# Patient Record
Sex: Male | Born: 1965 | Race: White | Hispanic: No | Marital: Married | Smoking: Current every day smoker
Health system: Southern US, Community
[De-identification: ages and names within clinical notes are randomized; demographics above are authoritative.]

## PROBLEM LIST (undated history)

## (undated) DIAGNOSIS — C4490 Unspecified malignant neoplasm of skin, unspecified: Secondary | ICD-10-CM

## (undated) DIAGNOSIS — E119 Type 2 diabetes mellitus without complications: Secondary | ICD-10-CM

## (undated) HISTORY — PX: BACK SURGERY: SHX140

---

## 2015-07-07 DIAGNOSIS — D126 Benign neoplasm of colon, unspecified: Secondary | ICD-10-CM | POA: Insufficient documentation

## 2017-04-09 HISTORY — PX: IRRIGATION AND DEBRIDEMENT ABSCESS: SHX5252

## 2017-04-12 HISTORY — PX: DENTAL SURGERY: SHX609

## 2017-08-06 HISTORY — PX: DENTAL SURGERY: SHX609

## 2021-11-24 ENCOUNTER — Encounter (HOSPITAL_COMMUNITY): Payer: Self-pay

## 2021-11-24 ENCOUNTER — Emergency Department (HOSPITAL_COMMUNITY)
Admission: EM | Admit: 2021-11-24 | Discharge: 2021-11-25 | Disposition: A | Discharge status: Home / Self Care | Payer: No Typology Code available for payment source | Attending: Emergency Medicine | Admitting: Emergency Medicine

## 2021-11-24 ENCOUNTER — Other Ambulatory Visit: Payer: Self-pay

## 2021-11-24 ENCOUNTER — Emergency Department (HOSPITAL_COMMUNITY): Payer: No Typology Code available for payment source

## 2021-11-24 DIAGNOSIS — R569 Unspecified convulsions: Secondary | ICD-10-CM | POA: Diagnosis not present

## 2021-11-24 DIAGNOSIS — S32021A Stable burst fracture of second lumbar vertebra, initial encounter for closed fracture: Secondary | ICD-10-CM | POA: Insufficient documentation

## 2021-11-24 DIAGNOSIS — E119 Type 2 diabetes mellitus without complications: Secondary | ICD-10-CM | POA: Diagnosis not present

## 2021-11-24 DIAGNOSIS — S34102A Unspecified injury to L2 level of lumbar spinal cord, initial encounter: Secondary | ICD-10-CM | POA: Diagnosis present

## 2021-11-24 DIAGNOSIS — X58XXXA Exposure to other specified factors, initial encounter: Secondary | ICD-10-CM | POA: Diagnosis not present

## 2021-11-24 HISTORY — DX: Type 2 diabetes mellitus without complications: E11.9

## 2021-11-24 HISTORY — DX: Unspecified malignant neoplasm of skin, unspecified: C44.90

## 2021-11-24 LAB — COMPREHENSIVE METABOLIC PANEL
ALT: 31 U/L (ref 0–44)
AST: 29 U/L (ref 15–41)
Albumin: 4 g/dL (ref 3.5–5.0)
Alkaline Phosphatase: 70 U/L (ref 38–126)
Anion gap: 9 (ref 5–15)
BUN: 18 mg/dL (ref 6–20)
CO2: 20 mmol/L — ABNORMAL LOW (ref 22–32)
Calcium: 8.7 mg/dL — ABNORMAL LOW (ref 8.9–10.3)
Chloride: 103 mmol/L (ref 98–111)
Creatinine, Ser: 1.14 mg/dL (ref 0.61–1.24)
GFR, Estimated: >60 mL/min (ref >60)
Glucose, Bld: 299 mg/dL — ABNORMAL HIGH (ref 70–99)
Potassium: 4.5 mmol/L (ref 3.5–5.1)
Sodium: 132 mmol/L — ABNORMAL LOW (ref 135–145)
Total Bilirubin: 0.6 mg/dL (ref 0.3–1.2)
Total Protein: 6.7 g/dL (ref 6.5–8.1)

## 2021-11-24 LAB — CBC WITH DIFFERENTIAL/PLATELET
Abs Immature Granulocytes: 0.35 10*3/uL — ABNORMAL HIGH (ref 0.00–0.07)
Basophils Absolute: 0.1 10*3/uL (ref 0.0–0.1)
Basophils Relative: 0 %
Eosinophils Absolute: 0 10*3/uL (ref 0.0–0.5)
Eosinophils Relative: 0 %
HCT: 44.2 % (ref 39.0–52.0)
Hemoglobin: 15.5 g/dL (ref 13.0–17.0)
Immature Granulocytes: 2 %
Lymphocytes Relative: 7 %
Lymphs Abs: 1.3 10*3/uL (ref 0.7–4.0)
MCH: 32.6 pg (ref 26.0–34.0)
MCHC: 35.1 g/dL (ref 30.0–36.0)
MCV: 92.9 fL (ref 80.0–100.0)
Monocytes Absolute: 1 10*3/uL (ref 0.1–1.0)
Monocytes Relative: 5 %
Neutro Abs: 16.7 10*3/uL — ABNORMAL HIGH (ref 1.7–7.7)
Neutrophils Relative %: 86 %
Platelets: 242 10*3/uL (ref 150–400)
RBC: 4.76 MIL/uL (ref 4.22–5.81)
RDW: 12 % (ref 11.5–15.5)
WBC: 19.4 10*3/uL — ABNORMAL HIGH (ref 4.0–10.5)
nRBC: 0 % (ref 0.0–0.2)

## 2021-11-24 LAB — ETHANOL: Alcohol, Ethyl (B): <10 mg/dL (ref <10)

## 2021-11-24 IMAGING — CR DG LUMBAR SPINE COMPLETE 4+V
5 series · 5 of 5 positions shown · non-contrast
Comparison: None Available.

CLINICAL DATA: Seizure, severe low back pain.

EXAM:
LUMBAR SPINE - COMPLETE 4+ VIEW

[l-spine ap]
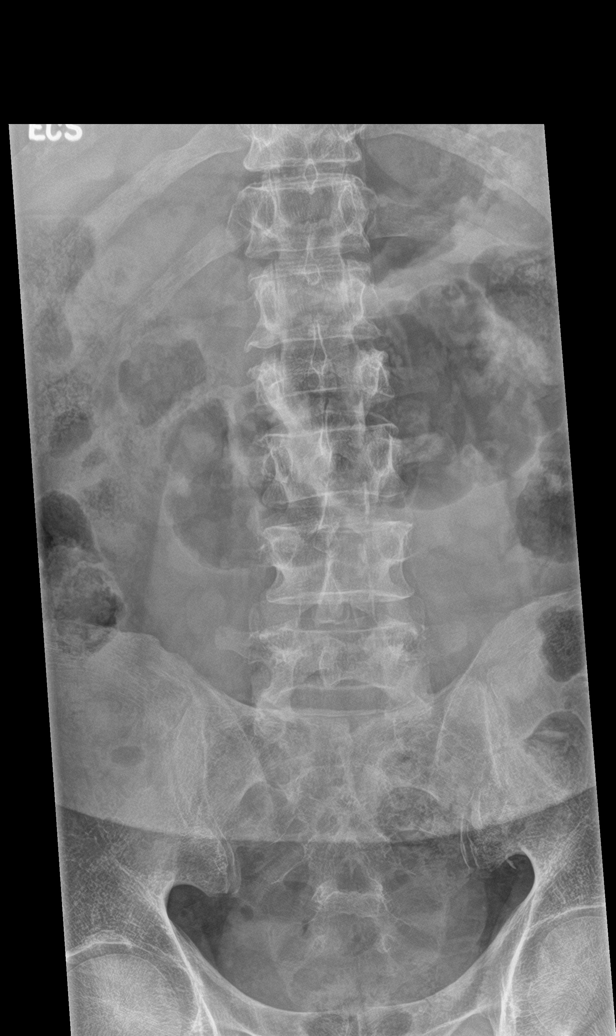

[l-spine obl (1 of 2)]
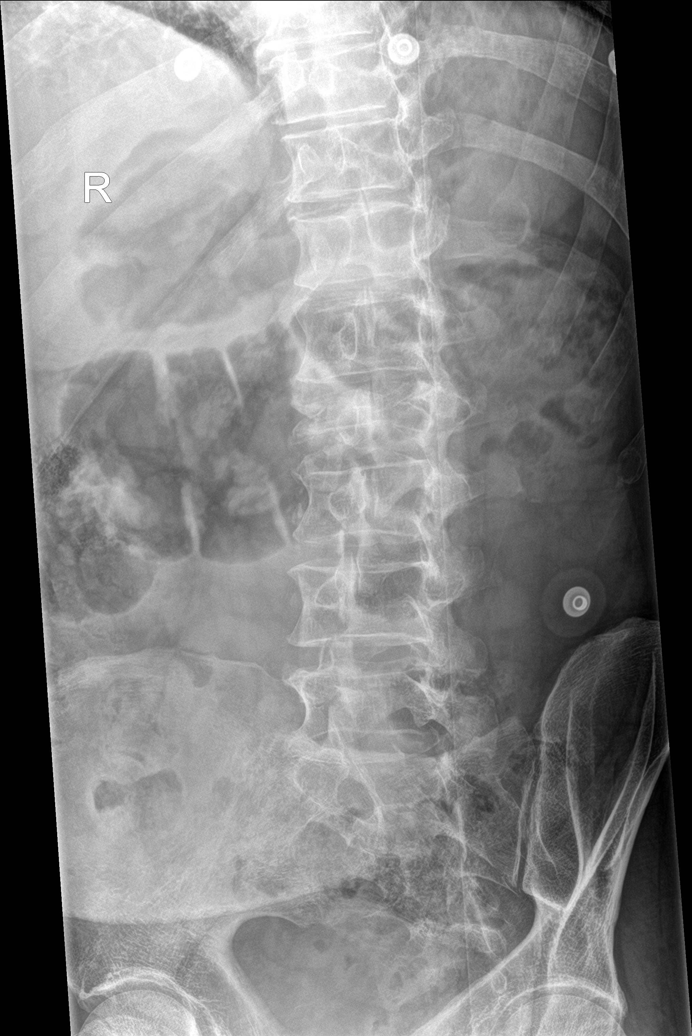

[l-spine obl (2 of 2)]
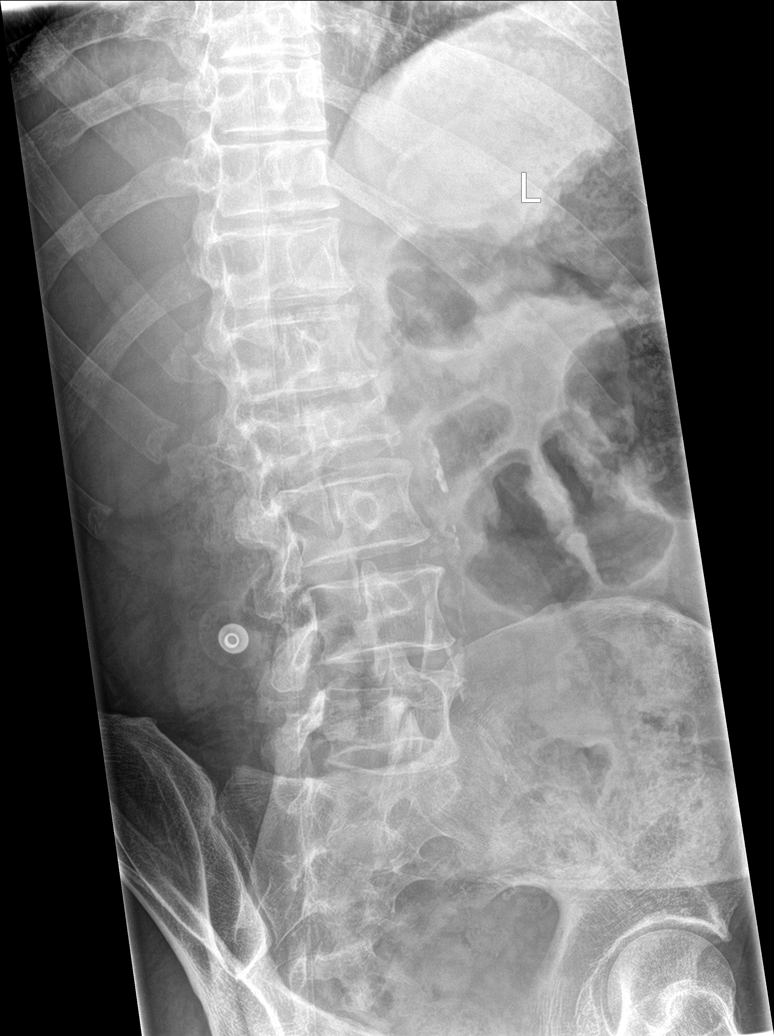

[l-spine lat]
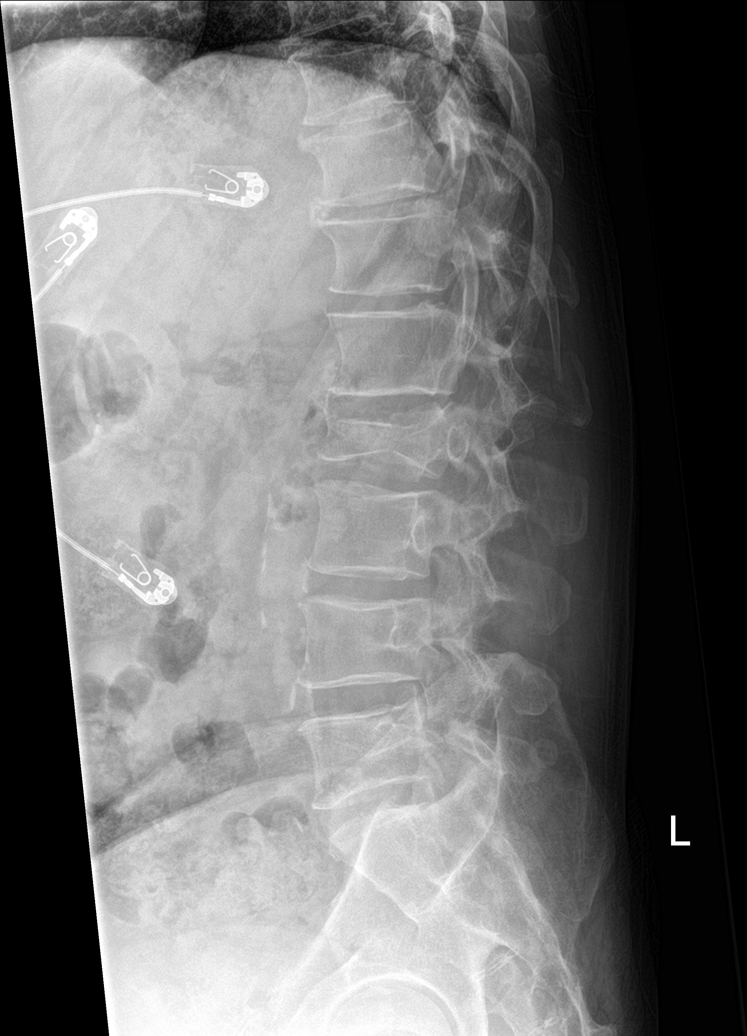

[l-spine spot]
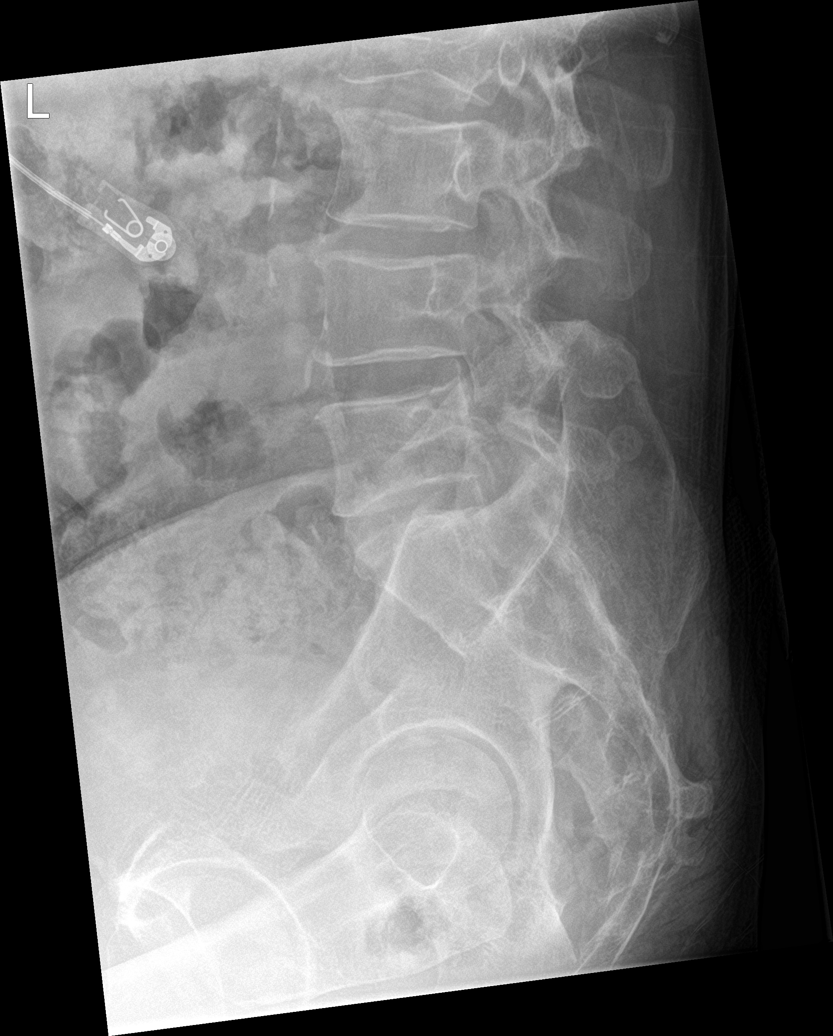

[5 of 5 positions shown; findings below may reference images not displayed]

FINDINGS: There is an acute compression deformity with a vertical elements at
L2 with loss of vertebral body height of approximately 60% a
slightly retropulsed element is noted. Mild retrolisthesis at L4-L5.
Intervertebral disc space is maintained in the lumbar spine. There
is mild intervertebral disc space narrowing with degenerative
endplate changes in the thoracic spine. Atherosclerotic
calcification of the aorta is noted.
IMPRESSION: Acute compression fracture at L2 with loss of vertebral body height
of approximately 60% and slightly retropulsed element.

## 2021-11-24 IMAGING — CT CT HEAD W/O CM
4 of 5 series · 15 of 47 positions shown, 17 images · non-contrast
Comparison: None Available.

CLINICAL DATA: New onset seizure



[Series 3: head without · axial · non-contrast · 0.46mm/px · z∈[-514,-394]mm · 5 of 38 slices shown, 7 images]
[im 7/38  brain]
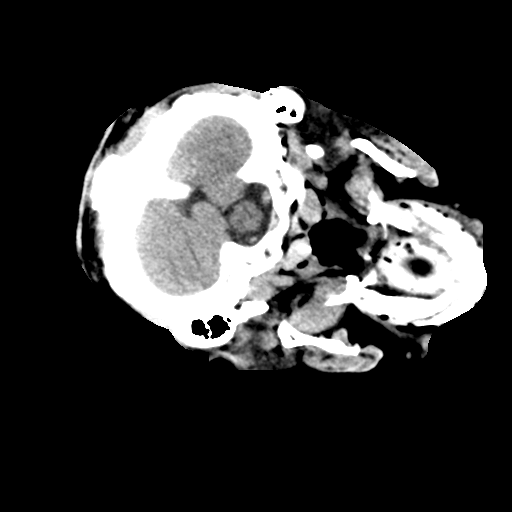
[im 7/38  bone]
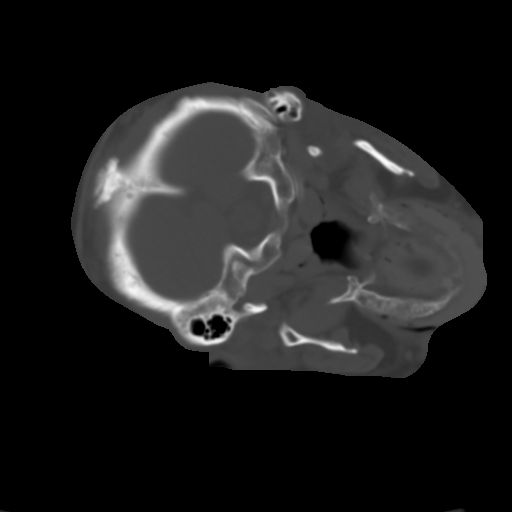
[im 13/38  brain]
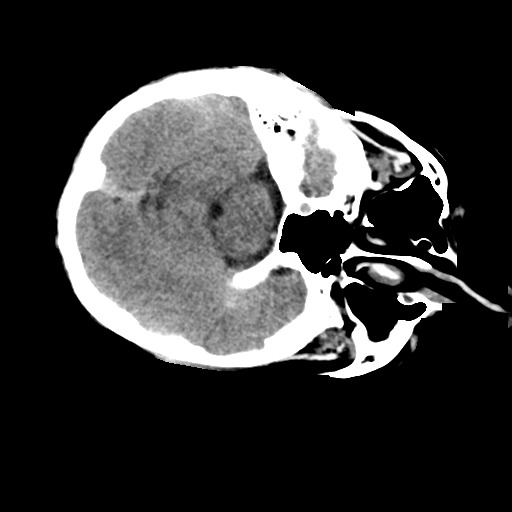
[im 19/38  brain]
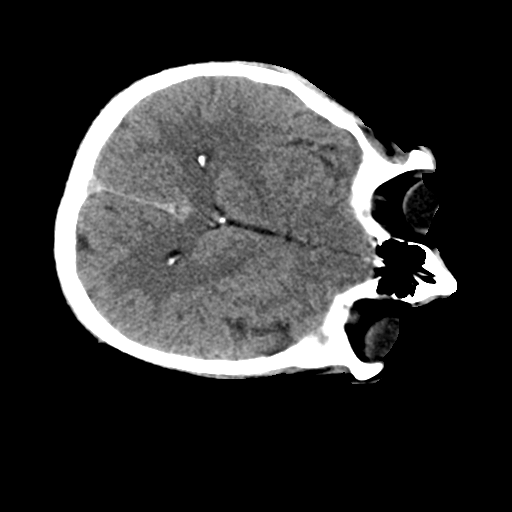
[im 25/38  brain]
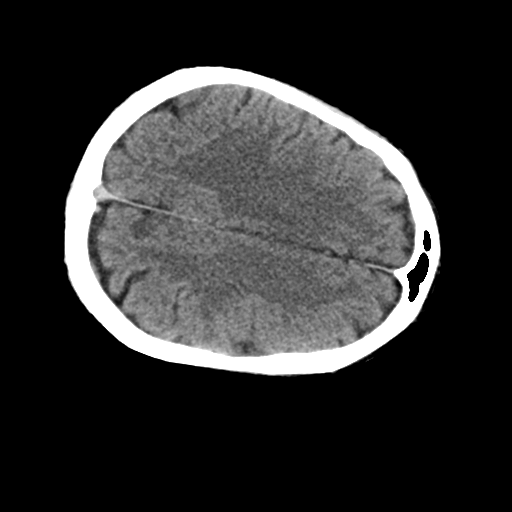
[im 31/38  brain]
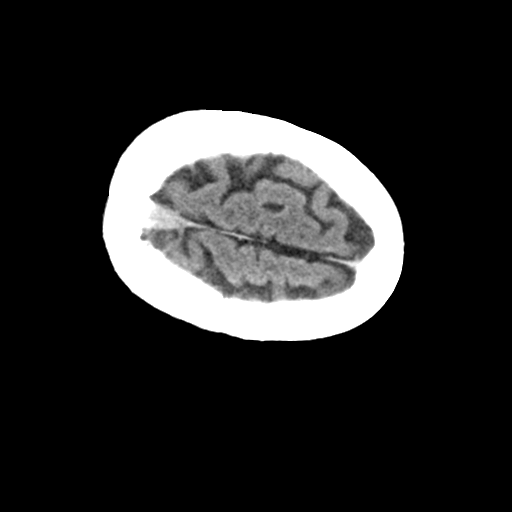
[im 31/38  bone]
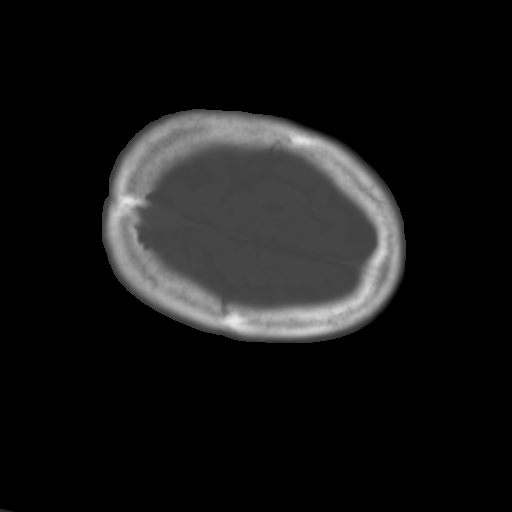

[Series 5: head without sag · coronal · non-contrast · 0.38mm/px · 3 of 71 slices shown]
[im 25/71  brain]
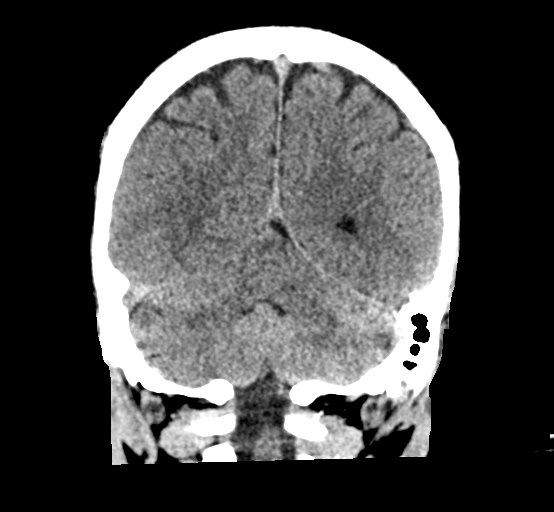
[im 32/71  brain]
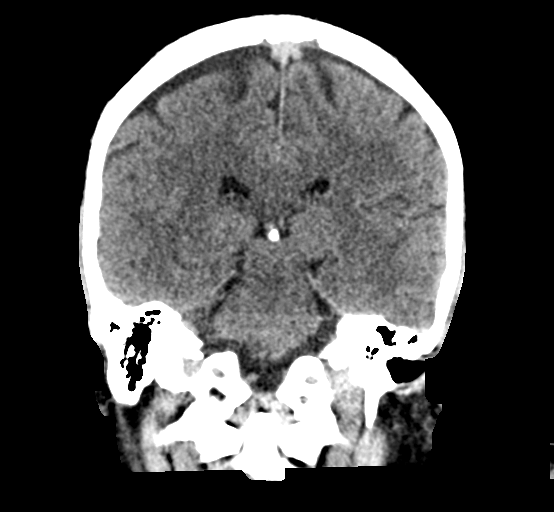
[im 39/71  brain]
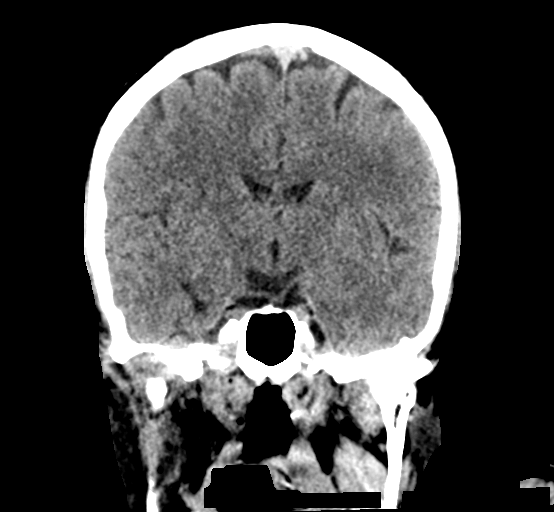

[Series 6: head without cor · sagittal · non-contrast · 0.38mm/px · 3 of 67 slices shown]
[im 23/67  brain]
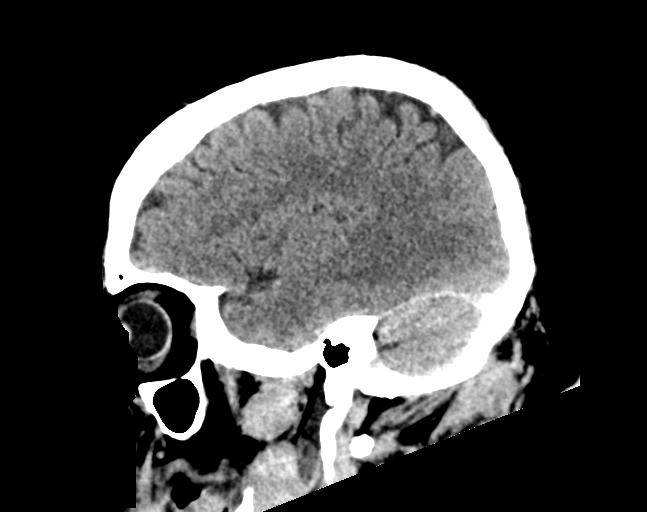
[im 34/67  brain]
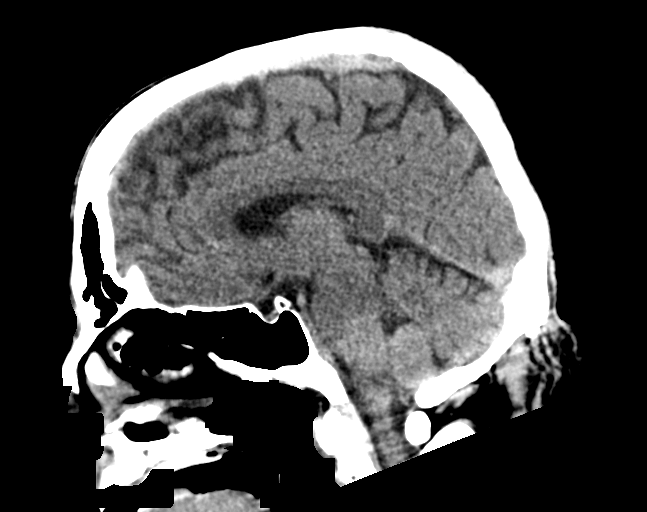
[im 45/67  brain]
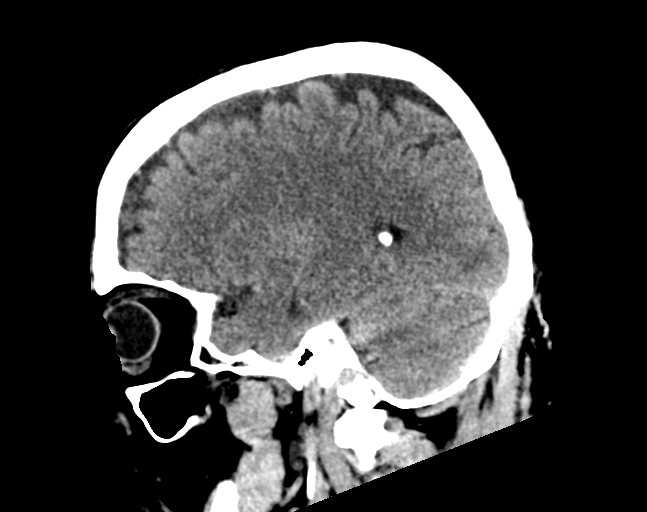

[Series 7: head without ax · axial · non-contrast · 0.36mm/px · z∈[-468,-381]mm · 4 of 39 slices shown]
[im 7/39  brain]
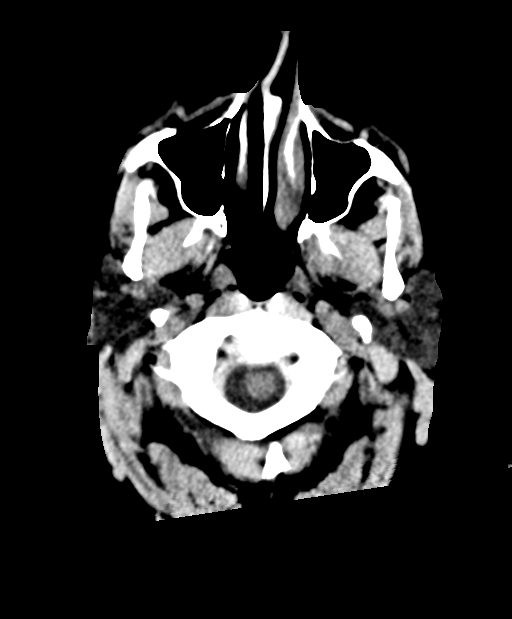
[im 13/39  brain]
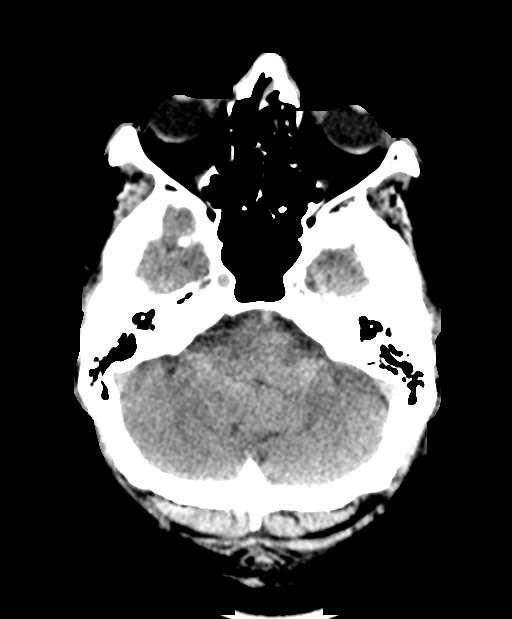
[im 20/39  brain]
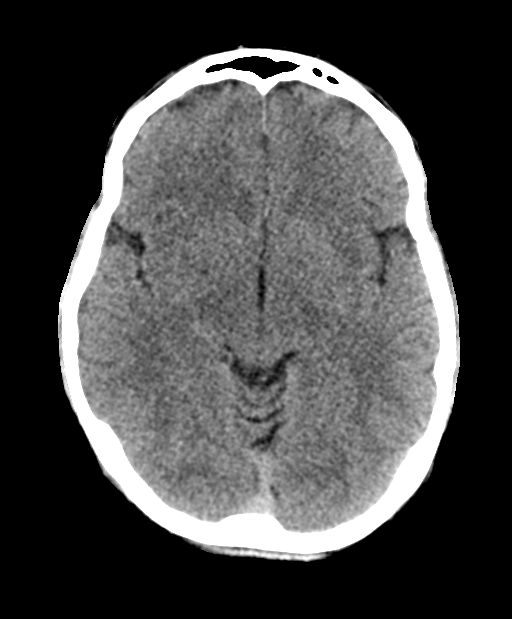
[im 26/39  brain]
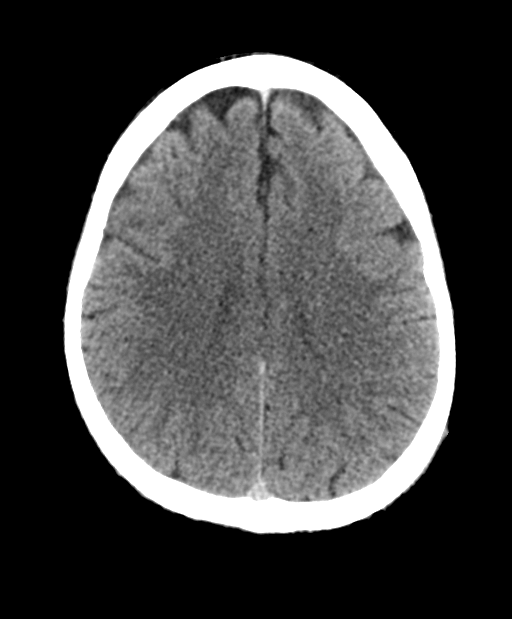

[15 of 47 positions shown; findings below may reference images not displayed]

FINDINGS: Brain: No acute infarct or hemorrhage. Lateral ventricles and
midline structures are unremarkable. No acute extra-axial fluid
collections. No mass effect.

Vascular: No hyperdense vessel or unexpected calcification.

Skull: Normal. Negative for fracture or focal lesion.

Sinuses/Orbits: No acute finding.

Other: None.
IMPRESSION: 1. No acute intracranial process.

## 2021-11-24 IMAGING — CT CT L SPINE W/O CM
4 of 6 series · 13 of 33 positions shown, 15 images · non-contrast
Comparison: No prior CT, correlation is made with lumbar spine
radiographs [DATE]

CLINICAL DATA: Seizure, severe low back pain, L2 compression
fracture on x-ray.



[Series 4: orthogonal · axial · 0.21mm/px · z∈[-614,-452]mm · 5 of 114 slices shown, 7 images]
[im 19/114  soft-tissue]
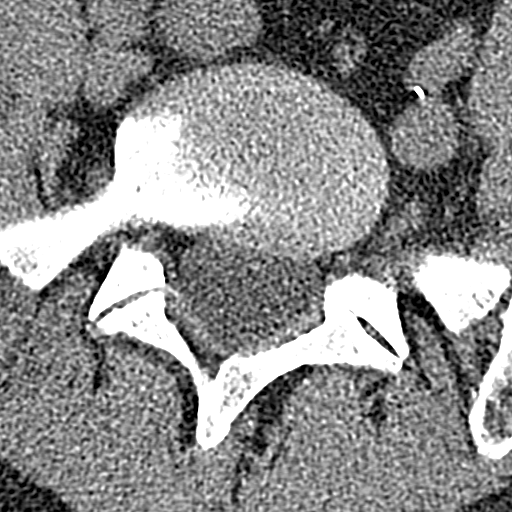
[im 19/114  bone]
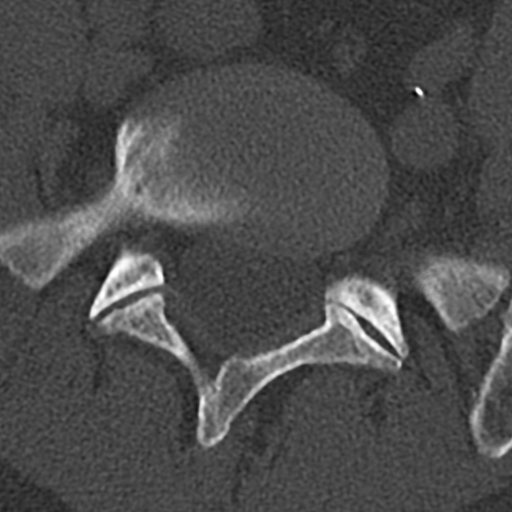
[im 38/114  bone]
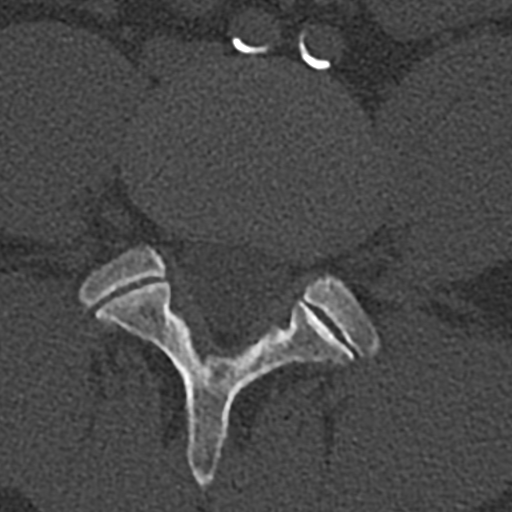
[im 57/114  bone]
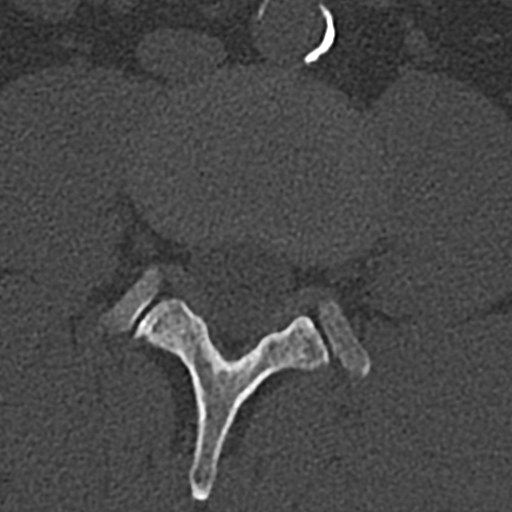
[im 76/114  bone]
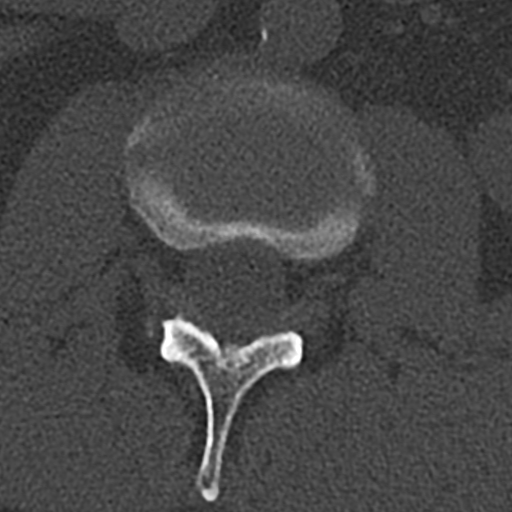
[im 95/114  soft-tissue]
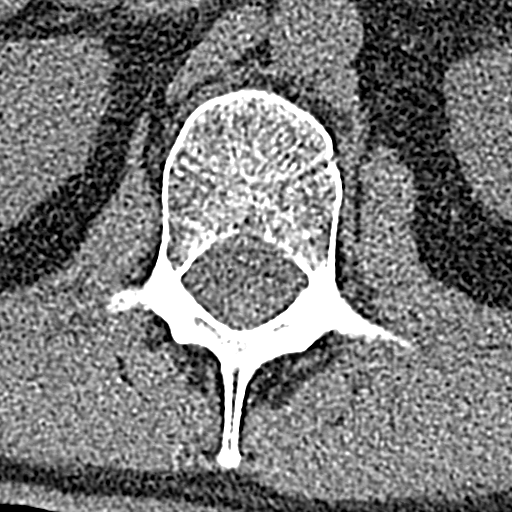
[im 95/114  bone]
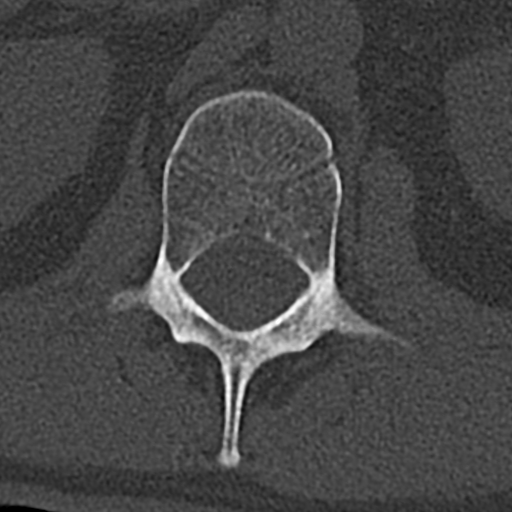

[Series 6: l spine soft · axial · 0.28mm/px · z∈[-598,-562]mm · 2 of 110 slices shown]
[im 19/110  soft-tissue]
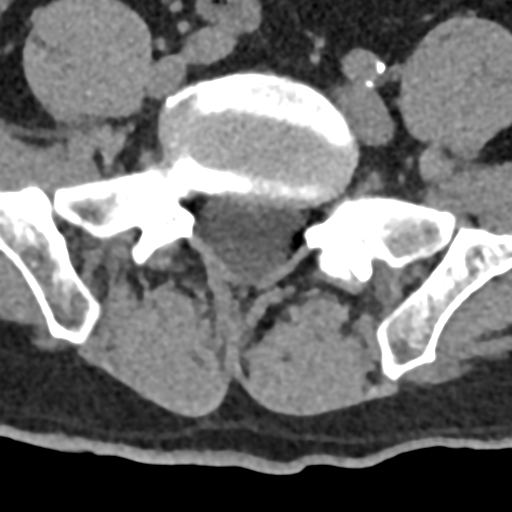
[im 37/110  soft-tissue]
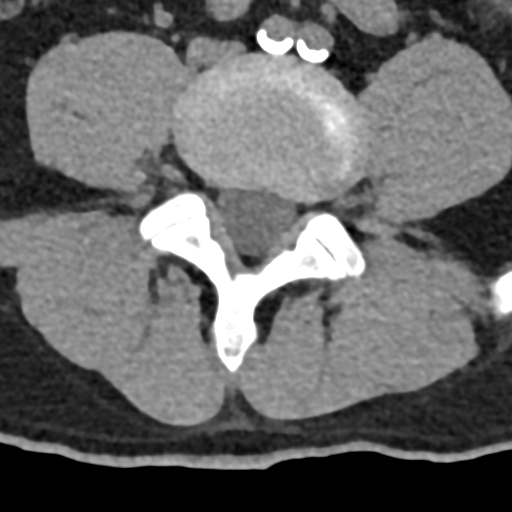

[Series 8: sag bone · sagittal · 0.29mm/px · 5 of 74 slices shown]
[im 13/74  bone]
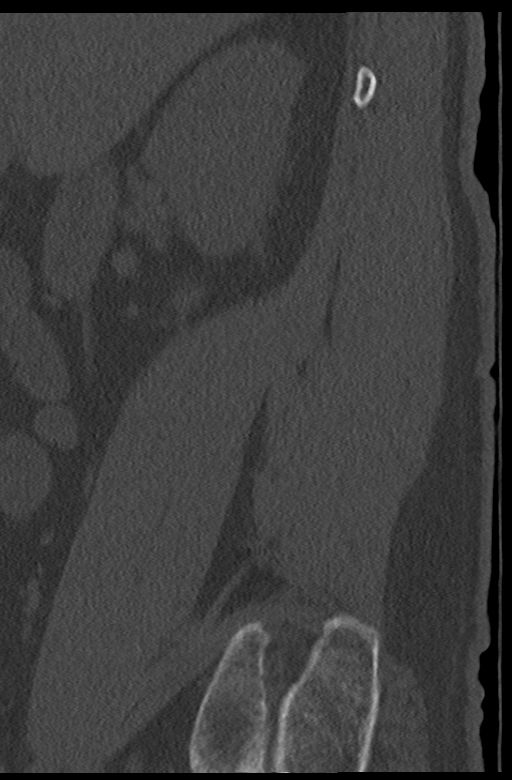
[im 25/74  bone]
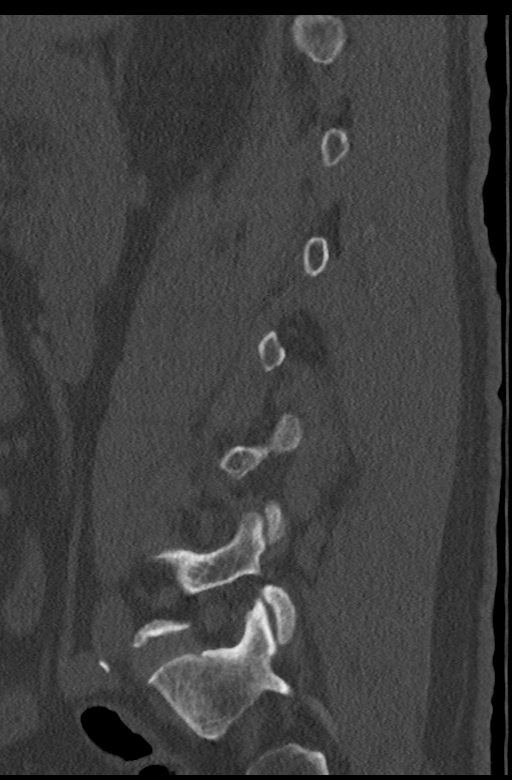
[im 37/74  bone]
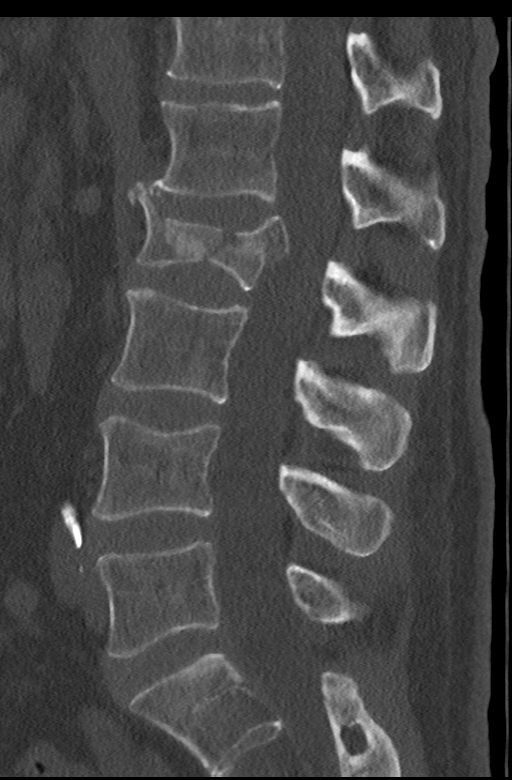
[im 49/74  bone]
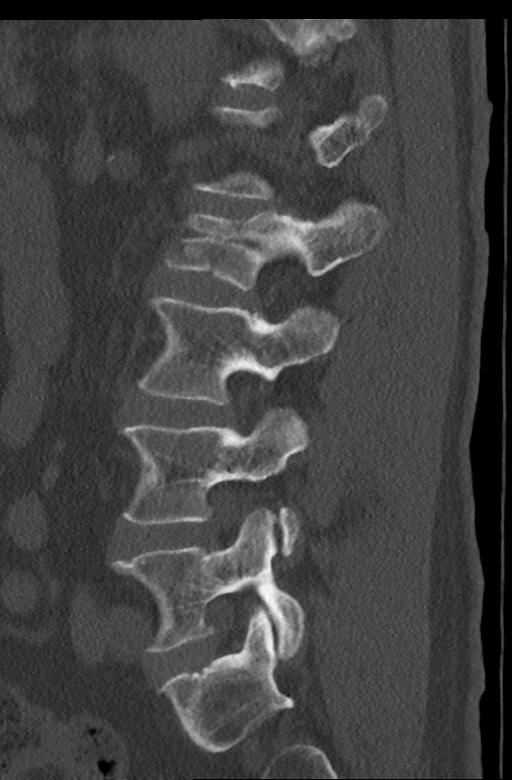
[im 61/74  bone]
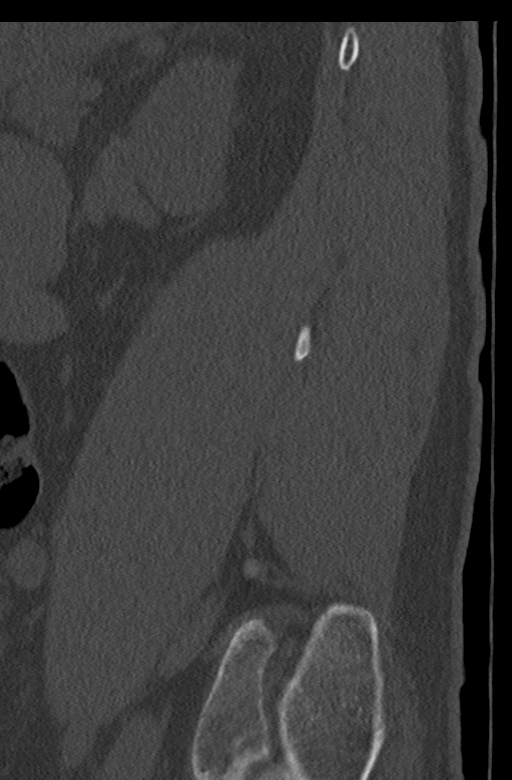

[Series 10: cor bone · coronal · 0.29mm/px · 1 of 74 slices shown]
[im 37/74  bone]
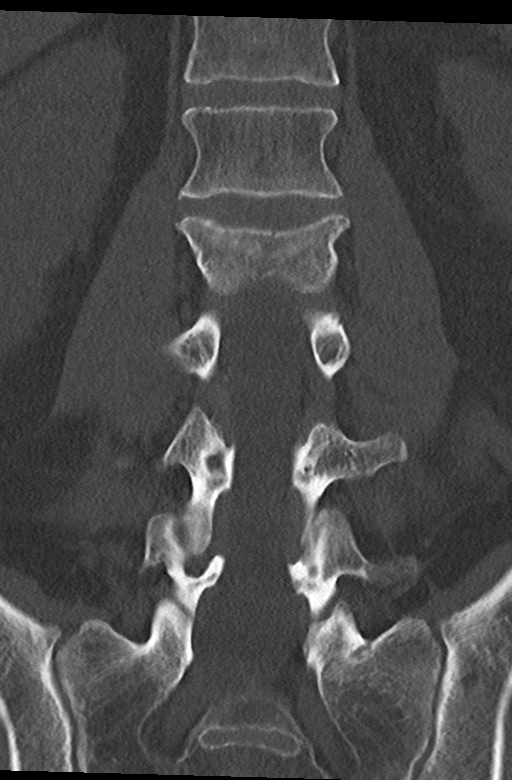

[13 of 33 positions shown; findings below may reference images not displayed]

FINDINGS: Segmentation: 5 lumbar type vertebrae.

Alignment: No listhesis.  Mild levocurvature.

Vertebrae: Acute burst fracture of the L2 vertebral body, which
extends through the anterior, posterior, superior, and inferior
cortex but does not appear to involve the posterior elements. There
is approximately 65% vertebral body height loss, some of which may
have been present prior to this acute fracture. 8 mm retropulsion of
the posterosuperior cortex, which causes approximately 40% canal
narrowing. No other acute fracture or suspicious osseous lesion.

Paraspinal and other soft tissues: Knee aortic atherosclerosis. No
acute finding.

Disc levels:

T12-L1: No significant disc bulge. No spinal canal stenosis or
neural foraminal narrowing.

L1-L2: Minimal disc bulge. No spinal canal stenosis at the level of
the disc space. 8 mm retropulsion of the posterosuperior cortex of
L2, which causes at least mild spinal canal stenosis and narrows the
lateral recesses. No neural foraminal narrowing.

L2-L3: Minimal disc bulge. No spinal canal stenosis or neural
foraminal narrowing.

L3-L4: No significant disc bulge. No spinal canal stenosis or neural
foraminal narrowing.

L4-L5: Minimal disc bulge. No spinal canal stenosis or neural
foraminal narrowing.

L5-S1: No significant disc bulge. No spinal canal stenosis or neural
foraminal narrowing.
IMPRESSION: 1. Acute burst fracture of L2, which does not appear to involve the
posterior elements, with 8 mm retropulsion of the posterosuperior
cortex, resulting in 40% spinal canal narrowing just below the L1-L2
level. This may have been superimposed on a more chronic compression
deformity.
2. No other spinal canal stenosis or neural foraminal narrowing.

## 2021-11-24 MED ORDER — DIAZEPAM 5 MG/ML IJ SOLN
2.5000 mg | Freq: Once | INTRAMUSCULAR | Status: AC
  Administered 2021-11-24: 2.5 mg via INTRAVENOUS
  Filled 2021-11-24: qty 2

## 2021-11-24 MED ORDER — OXYCODONE-ACETAMINOPHEN 5-325 MG PO TABS
1.0000 | ORAL_TABLET | Freq: Once | ORAL | Status: AC
  Administered 2021-11-24: 1 via ORAL
  Filled 2021-11-24: qty 1

## 2021-11-24 MED ORDER — HYDROMORPHONE HCL 1 MG/ML IJ SOLN
1.0000 mg | Freq: Once | INTRAMUSCULAR | Status: AC
  Administered 2021-11-24: 1 mg via INTRAVENOUS
  Filled 2021-11-24: qty 1

## 2021-11-24 NOTE — ED Provider Notes (Signed)
[facility] EMERGENCY DEPARTMENT Provider Note   CSN: [id number] Arrival date & time: 11/24/21  2129     History {Add pertinent medical, surgical, social history, OB history to HPI:1} Chief Complaint  Patient presents with   Back Pain    Lenward Able is a 56 y.o. male.  The history is provided by the patient and medical records.  Back Pain  55 y.o. M with hx of DM presenting to the ED for possible seizure.  Patient states the last thing he remembers was that he was laying down on the couch and when he came to he was in the floor with EMS around him.  Wife thinks he had a seizure per EMS.  He has no hx of seizures or other neurologic issues in the past.  He denies any headache or confusion but does have severe lower back pain.  States it feels like muscle spasms.  He denies numbness/weakness of the legs.  No bowel or bladder incontinence.  Home Medications Prior to Admission medications   Not on File      Allergies    Patient has no known allergies.    Review of Systems   Review of Systems  Musculoskeletal:  Positive for back pain.  All other systems reviewed and are negative.  Physical Exam Updated Vital Signs BP 106/78   Pulse 75   Temp 98 F (36.7 C) (Oral)   Resp (!) 21   Ht '5\' 8"'$  (1.727 m)   Wt 72.6 kg   SpO2 94%   BMI 24.33 kg/m   Physical Exam Vitals and nursing note reviewed.  Constitutional:      Appearance: He is well-developed.     Comments: Intermittently yelling and screaming during exam  HENT:     Head: Normocephalic and atraumatic.     Comments: atraumatic    Mouth/Throat:     Comments: No tongue laceration or dental injury noted Eyes:     Conjunctiva/sclera: Conjunctivae normal.     Pupils: Pupils are equal, round, and reactive to light.     Comments: PERRL  Cardiovascular:     Rate and Rhythm: Normal rate and regular rhythm.     Heart sounds: Normal heart sounds.  Pulmonary:     Effort: Pulmonary effort is normal.  No respiratory distress.     Breath sounds: Normal breath sounds. No rhonchi.  Abdominal:     General: Bowel sounds are normal.     Palpations: Abdomen is soft.  Musculoskeletal:        General: Normal range of motion.     Cervical back: Normal range of motion.  Skin:    General: Skin is warm and dry.  Neurological:     Mental Status: He is alert and oriented to person, place, and time.     Comments: AAOx3, screaming and yelling during exam but able to answer questions and follow commands, extremities NVI x4    ED Results / Procedures / Treatments   Labs (all labs ordered are listed, but only abnormal results are displayed) Labs Reviewed  CBC WITH DIFFERENTIAL/PLATELET - Abnormal; Notable for the following components:      Result Value   WBC 19.4 (*)    Neutro Abs 16.7 (*)    Abs Immature Granulocytes 0.35 (*)    All other components within normal limits  COMPREHENSIVE METABOLIC PANEL  ETHANOL  RAPID URINE DRUG SCREEN, HOSP PERFORMED    EKG None  Radiology DG Lumbar Spine Complete  Result  Date: 11/24/2021 CLINICAL DATA:  Seizure, severe low back pain. EXAM: LUMBAR SPINE - COMPLETE 4+ VIEW COMPARISON:  None Available. FINDINGS: There is an acute compression deformity with a vertical elements at L2 with loss of vertebral body height of approximately 60% a slightly retropulsed element is noted. Mild retrolisthesis at L4-L5. Intervertebral disc space is maintained in the lumbar spine. There is mild intervertebral disc space narrowing with degenerative endplate changes in the thoracic spine. Atherosclerotic calcification of the aorta is noted. IMPRESSION: Acute compression fracture at L2 with loss of vertebral body height of approximately 60% and slightly retropulsed element. Electronically Signed   By: Brett Fairy M.D.   On: 11/24/2021 22:56   CT HEAD WO CONTRAST (5MM)  Result Date: 11/24/2021 CLINICAL DATA:  New onset seizure EXAM: CT HEAD WITHOUT CONTRAST TECHNIQUE: Contiguous  axial images were obtained from the base of the skull through the vertex without intravenous contrast. RADIATION DOSE REDUCTION: This exam was performed according to the departmental dose-optimization program which includes automated exposure control, adjustment of the mA and/or kV according to patient size and/or use of iterative reconstruction technique. COMPARISON:  None Available. FINDINGS: Brain: No acute infarct or hemorrhage. Lateral ventricles and midline structures are unremarkable. No acute extra-axial fluid collections. No mass effect. Vascular: No hyperdense vessel or unexpected calcification. Skull: Normal. Negative for fracture or focal lesion. Sinuses/Orbits: No acute finding. Other: None. IMPRESSION: 1. No acute intracranial process. Electronically Signed   By: Randa Ngo M.D.   On: 11/24/2021 23:04    Procedures Procedures  {Document cardiac monitor, telemetry assessment procedure when appropriate:1}  Medications Ordered in ED Medications  HYDROmorphone (DILAUDID) injection 1 mg (has no administration in time range)  diazepam (VALIUM) injection 2.5 mg (has no administration in time range)  oxyCODONE-acetaminophen (PERCOCET/ROXICET) 5-325 MG per tablet 1 tablet (1 tablet Oral Given 11/24/21 2256)    ED Course/ Medical Decision Making/ A&P                           Medical Decision Making Amount and/or Complexity of Data Reviewed Labs: ordered. Radiology: ordered.  Risk Prescription drug management.   56 year old M presenting to the ED after questionable seizure.  Last thing he remembers was he was lying down on the couch to take a nap, woke up in the floor with paramedics around him.  He has no history of seizures in the past.  He denies any headache or confusion but reports severe low back pain.  He denies any numbness or weakness of the legs.  No bowel or bladder incontinence.  We will check screening labs, CT head given questionable new onset seizure, lumbar films.   Percocet given for pain.  X-rays with L2 compression fracture, approximately 60% height loss and some mild retropulsion.  CT head is negative.  Remains neurologically intact.  Discussed with neurosurgery, PA Kim-- recommends CT lumbar spine, as he is diabetic we will avoid steroids, pain meds, muscle relaxers, TLSO brace and office follow-up in 1 week.  Final Clinical Impression(s) / ED Diagnoses Final diagnoses:  None    Rx / DC Orders ED Discharge Orders     None

## 2021-11-24 NOTE — ED Triage Notes (Signed)
Arrived via EMS due to apparent seizure.  Wife heard patient yell out, she then found him having what was an apparent seizure with foaming at the mouth.  Patient also complains with severe lower back pain.

## 2021-11-25 ENCOUNTER — Emergency Department (HOSPITAL_COMMUNITY)
Admission: EM | Admit: 2021-11-25 | Discharge: 2021-11-26 | Disposition: A | Discharge status: Home / Self Care | Payer: No Typology Code available for payment source | Attending: Emergency Medicine | Admitting: Emergency Medicine

## 2021-11-25 DIAGNOSIS — W08XXXA Fall from other furniture, initial encounter: Secondary | ICD-10-CM | POA: Insufficient documentation

## 2021-11-25 DIAGNOSIS — S32021A Stable burst fracture of second lumbar vertebra, initial encounter for closed fracture: Secondary | ICD-10-CM | POA: Diagnosis not present

## 2021-11-25 DIAGNOSIS — E1165 Type 2 diabetes mellitus with hyperglycemia: Secondary | ICD-10-CM | POA: Insufficient documentation

## 2021-11-25 DIAGNOSIS — J45909 Unspecified asthma, uncomplicated: Secondary | ICD-10-CM | POA: Diagnosis not present

## 2021-11-25 DIAGNOSIS — S3992XA Unspecified injury of lower back, initial encounter: Secondary | ICD-10-CM | POA: Diagnosis present

## 2021-11-25 DIAGNOSIS — D72829 Elevated white blood cell count, unspecified: Secondary | ICD-10-CM | POA: Insufficient documentation

## 2021-11-25 MED ORDER — SODIUM CHLORIDE 0.9 % IV BOLUS
1000.0000 mL | Freq: Once | INTRAVENOUS | Status: AC
  Administered 2021-11-26: 1000 mL via INTRAVENOUS

## 2021-11-25 MED ORDER — OXYCODONE-ACETAMINOPHEN 5-325 MG PO TABS: 1.0000 | ORAL_TABLET | ORAL | 0 refills | Status: DC | PRN

## 2021-11-25 MED ORDER — METHOCARBAMOL 500 MG PO TABS: 500.0000 mg | ORAL_TABLET | Freq: Two times a day (BID) | ORAL | 0 refills | Status: DC

## 2021-11-25 MED ORDER — HYDROMORPHONE HCL 1 MG/ML IJ SOLN
1.0000 mg | INTRAMUSCULAR | Status: AC
  Administered 2021-11-26 (×2): 1 mg via INTRAVENOUS
  Filled 2021-11-25 (×2): qty 1

## 2021-11-25 NOTE — ED Provider Notes (Incomplete)
[facility] EMERGENCY DEPARTMENT Provider Note   CSN: [id number] Arrival date & time: 11/25/21  1834     History {Add pertinent medical, surgical, social history, OB history to HPI:1} Chief Complaint  Patient presents with  . Back Pain    Cody Berger is a 56 y.o. male.  HPI  Medical history including diabetes, asthma, chronic migraines presents  with complaints of worsening lower back pain.  Patient states that he was seen here yesterday was diagnosed with a fractured lumbar spine.  He states yesterday he fell asleep on his couch and then woke up with paramedics all around him.  He states he does not know what happened.  He states that he was in significant amount of pain, he denies history of seizure disorders, denies any recent head trauma, he is not on anticoag, he denies any illicit drug use, he denies alcohol dependency or alcoholic seizures, he denies any trauma to his back that he is aware of.  He states that since being discharged from the hospital he is having worsening pain in his back, pain remains right in his lower lumbar region does not radiate, has a slight sensation of feeling slightly more cold on the left leg, but denies bowel or urinary incontinency, no saddle paresthesias.  He denies any headache change in vision paresthesias or weakness of her lower extremities.  He has no other complaints.  Patient's wife is at bedside able to validate the story, she states that yesterday she heard her husband screaming in pain she found him on the couch shaking and was not coherent, she states he had fully body shaking for about 30 secound to a minute and never truly regained consciousness.  She states is never happened in the past.  Lab review patient's chart was seen yesterday has acute burst fracture of L2 with 8 mm repolarization 40% spinal canal narrowing below the L1-L2 level.  He was placed in a TL SLO brace and was recommended follow-up in 1 week outpatient  neurosurgery.  Home Medications Prior to Admission medications   Medication Sig Start Date End Date Taking? Authorizing Provider  methocarbamol (ROBAXIN) 500 MG tablet Take 1 tablet (500 mg total) by mouth 2 (two) times daily. 11/25/21   [name], PA-C  oxyCODONE-acetaminophen (PERCOCET) 5-325 MG tablet Take 1 tablet by mouth every 4 (four) hours as needed. 11/25/21   [name], PA-C      Allergies    Patient has no known allergies.    Review of Systems   Review of Systems  Constitutional:  Negative for chills and fever.  Respiratory:  Negative for shortness of breath.   Cardiovascular:  Negative for chest pain.  Gastrointestinal:  Negative for abdominal pain.  Musculoskeletal:  Positive for back pain.  Neurological:  Negative for headaches.   Physical Exam Updated Vital Signs BP (!) 130/96 (BP Location: Left Arm)   Pulse (!) 101   Temp 99 F (37.2 C) (Oral)   Resp 17   SpO2 96%  Physical Exam Vitals and nursing note reviewed.  Constitutional:      General: He is not in acute distress.    Appearance: He is not ill-appearing.  HENT:     Head: Normocephalic and atraumatic.     Nose: No congestion.  Eyes:     Conjunctiva/sclera: Conjunctivae normal.  Cardiovascular:     Rate and Rhythm: Normal rate and regular rhythm.     Pulses: Normal pulses.     Heart  sounds: No murmur heard.   No friction rub. No gallop.  Pulmonary:     Effort: No respiratory distress.     Breath sounds: No wheezing, rhonchi or rales.  Skin:    General: Skin is warm and dry.  Neurological:     Mental Status: He is alert.  Psychiatric:        Mood and Affect: Mood normal.    ED Results / Procedures / Treatments   Labs (all labs ordered are listed, but only abnormal results are displayed) Labs Reviewed - No data to display  EKG None  Radiology DG Lumbar Spine Complete  Result Date: 11/24/2021 CLINICAL DATA:  Seizure, severe low back pain. EXAM: LUMBAR SPINE - COMPLETE 4+  VIEW COMPARISON:  None Available. FINDINGS: There is an acute compression deformity with a vertical elements at L2 with loss of vertebral body height of approximately 60% a slightly retropulsed element is noted. Mild retrolisthesis at L4-L5. Intervertebral disc space is maintained in the lumbar spine. There is mild intervertebral disc space narrowing with degenerative endplate changes in the thoracic spine. Atherosclerotic calcification of the aorta is noted. IMPRESSION: Acute compression fracture at L2 with loss of vertebral body height of approximately 60% and slightly retropulsed element. Electronically Signed   By: Brett Fairy M.D.   On: 11/24/2021 22:56   CT HEAD WO CONTRAST (5MM)  Result Date: 11/24/2021 CLINICAL DATA:  New onset seizure EXAM: CT HEAD WITHOUT CONTRAST TECHNIQUE: Contiguous axial images were obtained from the base of the skull through the vertex without intravenous contrast. RADIATION DOSE REDUCTION: This exam was performed according to the departmental dose-optimization program which includes automated exposure control, adjustment of the mA and/or kV according to patient size and/or use of iterative reconstruction technique. COMPARISON:  None Available. FINDINGS: Brain: No acute infarct or hemorrhage. Lateral ventricles and midline structures are unremarkable. No acute extra-axial fluid collections. No mass effect. Vascular: No hyperdense vessel or unexpected calcification. Skull: Normal. Negative for fracture or focal lesion. Sinuses/Orbits: No acute finding. Other: None. IMPRESSION: 1. No acute intracranial process. Electronically Signed   By: Randa Ngo M.D.   On: 11/24/2021 23:04   CT Lumbar Spine Wo Contrast  Result Date: 11/25/2021 CLINICAL DATA:  Seizure, severe low back pain, L2 compression fracture on x-ray. EXAM: CT LUMBAR SPINE WITHOUT CONTRAST TECHNIQUE: Multidetector CT imaging of the lumbar spine was performed without intravenous contrast administration. Multiplanar  CT image reconstructions were also generated. RADIATION DOSE REDUCTION: This exam was performed according to the departmental dose-optimization program which includes automated exposure control, adjustment of the mA and/or kV according to patient size and/or use of iterative reconstruction technique. COMPARISON:  No prior CT, correlation is made with lumbar spine radiographs 11/24/2021 FINDINGS: Segmentation: 5 lumbar type vertebrae. Alignment: No listhesis.  Mild levocurvature. Vertebrae: Acute burst fracture of the L2 vertebral body, which extends through the anterior, posterior, superior, and inferior cortex but does not appear to involve the posterior elements. There is approximately 65% vertebral body height loss, some of which may have been present prior to this acute fracture. 8 mm retropulsion of the posterosuperior cortex, which causes approximately 40% canal narrowing. No other acute fracture or suspicious osseous lesion. Paraspinal and other soft tissues: Knee aortic atherosclerosis. No acute finding. Disc levels: T12-L1: No significant disc bulge. No spinal canal stenosis or neural foraminal narrowing. L1-L2: Minimal disc bulge. No spinal canal stenosis at the level of the disc space. 8 mm retropulsion of the posterosuperior cortex of L2, which causes  at least mild spinal canal stenosis and narrows the lateral recesses. No neural foraminal narrowing. L2-L3: Minimal disc bulge. No spinal canal stenosis or neural foraminal narrowing. L3-L4: No significant disc bulge. No spinal canal stenosis or neural foraminal narrowing. L4-L5: Minimal disc bulge. No spinal canal stenosis or neural foraminal narrowing. L5-S1: No significant disc bulge. No spinal canal stenosis or neural foraminal narrowing. IMPRESSION: 1. Acute burst fracture of L2, which does not appear to involve the posterior elements, with 8 mm retropulsion of the posterosuperior cortex, resulting in 40% spinal canal narrowing just below the L1-L2  level. This may have been superimposed on a more chronic compression deformity. 2. No other spinal canal stenosis or neural foraminal narrowing. Electronically Signed   By: Merilyn Baba M.D.   On: 11/25/2021 00:19    Procedures Procedures  {Document cardiac monitor, telemetry assessment procedure when appropriate:1}  Medications Ordered in ED Medications  sodium chloride 0.9 % bolus 1,000 mL (has no administration in time range)  HYDROmorphone (DILAUDID) injection 1 mg (has no administration in time range)    ED Course/ Medical Decision Making/ A&P                           Medical Decision Making Risk Prescription drug management.   ***  {Document critical care time when appropriate:1} {Document review of labs and clinical decision tools ie heart score, Chads2Vasc2 etc:1}  {Document your independent review of radiology images, and any outside records:1} {Document your discussion with family members, caretakers, and with consultants:1} {Document social determinants of health affecting pt's care:1} {Document your decision making why or why not admission, treatments were needed:1} Final Clinical Impression(s) / ED Diagnoses Final diagnoses:  None    Rx / DC Orders ED Discharge Orders     None

## 2021-11-25 NOTE — Progress Notes (Signed)
Orthopedic Tech Progress Note Patient Details:  Cody Berger 1966/03/10 [id number]  Ortho Devices Type of Ortho Device: Thoracolumbar corset (TLSO) Ortho Device/Splint Interventions: Ordered, Application, Adjustment  I applied a TLSO to the patient with help from an ED EMT. The patient had a lot of pain before application and less afterward. Post Interventions Patient Tolerated: Well Instructions Provided: Care of device, Adjustment of device  Karolee Stamps 11/25/2021, 1:59 AM

## 2021-11-25 NOTE — ED Triage Notes (Signed)
Pt advises he was seen yesterday for seizure, advised he had closed, stable burst fracture of second lumbar vertebra. Was advised of need for admission but refused, [facility], advised to come back to ED & follow through w admission.

## 2021-11-25 NOTE — Discharge Instructions (Signed)
Please follow-up with neurosurgery.  I have attached information for local clinic.  If you wish to go through the [facility], I would call them in the morning to get this set up. Take the prescribed medication as directed.  Do not drive or make important decisions while taking this medication as it can make you drowsy/sleepy. Return to the ED for any new or acute changes--numbness or weakness of the legs, bowel or bladder incontinence, etc..

## 2021-11-25 NOTE — ED Provider Notes (Signed)
[facility] EMERGENCY DEPARTMENT Provider Note   CSN: [id number] Arrival date & time: 11/25/21  1834     History  Chief Complaint  Patient presents with   Back Pain    Cody Berger is a 56 y.o. male.  HPI  Medical history including diabetes, asthma, chronic migraines presents  with complaints of worsening lower back pain.  Patient states that he was seen here yesterday was diagnosed with a fractured lumbar spine.  He states yesterday he fell asleep on his couch and then woke up with paramedics all around him.  He states he does not know what happened.  He states that he was in significant amount of pain, he denies history of seizure disorders, denies any recent head trauma, he is not on anticoag, he denies any illicit drug use, he denies alcohol dependency or alcoholic seizures, he denies any trauma to his back that he is aware of.  He states that since being discharged from the hospital he is having worsening pain in his back, pain remains right in his lower lumbar region does not radiate, has a slight sensation of feeling slightly more cold on the left leg, but denies bowel or urinary incontinency, no saddle paresthesias.  He denies any headache change in vision paresthesias or weakness of her lower extremities.  He has no other complaints.  Patient's wife is at bedside able to validate the story, she states that yesterday she heard her husband screaming in pain she found him on the couch shaking and was not coherent, she states he had fully body shaking for about 30 secound to a minute and never truly regained consciousness.  She states is never happened in the past.  Lab review patient's chart was seen yesterday has acute burst fracture of L2 with 8 mm repolarization 40% spinal canal narrowing below the L1-L2 level.  He was placed in a TL SLO brace and was recommended follow-up in 1 week outpatient neurosurgery.  Home Medications Prior to Admission medications    Medication Sig Start Date End Date Taking? Authorizing Provider  oxyCODONE-acetaminophen (PERCOCET) 10-325 MG tablet Take 1 tablet by mouth every 8 (eight) hours as needed for up to 5 days for pain. 11/26/21 12/01/21 Yes Marcello Fennel, PA-C  methocarbamol (ROBAXIN) 500 MG tablet Take 1 tablet (500 mg total) by mouth 2 (two) times daily. 11/25/21   [name], PA-C      Allergies    Patient has no known allergies.    Review of Systems   Review of Systems  Constitutional:  Negative for chills and fever.  Respiratory:  Negative for shortness of breath.   Cardiovascular:  Negative for chest pain.  Gastrointestinal:  Negative for abdominal pain.  Musculoskeletal:  Positive for back pain.  Neurological:  Negative for headaches.   Physical Exam Updated Vital Signs BP (!) 144/86   Pulse 74   Temp 99 F (37.2 C) (Oral)   Resp 18   SpO2 96%  Physical Exam Vitals and nursing note reviewed.  Constitutional:      General: He is not in acute distress.    Appearance: He is not ill-appearing.  HENT:     Head: Normocephalic and atraumatic.     Comments: No deformity of the head present no raccoon eyes or battle sign noted.    Nose: No congestion.     Mouth/Throat:     Comments: No trismus no torticollis no oral trauma present. Eyes:     Conjunctiva/sclera: Conjunctivae  normal.  Cardiovascular:     Rate and Rhythm: Normal rate and regular rhythm.     Pulses: Normal pulses.     Heart sounds: No murmur heard.   No friction rub. No gallop.  Pulmonary:     Effort: No respiratory distress.     Breath sounds: No wheezing, rhonchi or rales.  Abdominal:     Tenderness: There is no abdominal tenderness. There is no right CVA tenderness or left CVA tenderness.  Musculoskeletal:     Comments: Patient had a TLSO brace on difficult to fully examine his back but is able to palpate his cervical spine down to his thoracic spine without step-off or deformities noted.  Skin:    General: Skin  is warm and dry.  Neurological:     Mental Status: He is alert.     GCS: GCS eye subscore is 4. GCS verbal subscore is 5. GCS motor subscore is 6.     Comments: Cranial nerves II through XII grossly intact no difficulty word finding following two-step commands, no weakness in the upper extremities, limited exam in the lower extremities due to pain, he is able to wiggle his toes and slightly flex at the ankles minimally move his knees due to severe pain in his back.  He endorses slight feeling of coldness in his left leg and a little numbness on the medial aspect of his left knee, on my exam sensation is fully intact able to differentiate what toe is touching each foot and wear associate on his leg.  Psychiatric:        Mood and Affect: Mood normal.    ED Results / Procedures / Treatments   Labs (all labs ordered are listed, but only abnormal results are displayed) Labs Reviewed  BASIC METABOLIC PANEL - Abnormal; Notable for the following components:      Result Value   Glucose, Bld 275 (*)    All other components within normal limits  CBC WITH DIFFERENTIAL/PLATELET - Abnormal; Notable for the following components:   WBC 14.5 (*)    Neutro Abs 11.4 (*)    Abs Immature Granulocytes 0.08 (*)    All other components within normal limits    EKG None  Radiology DG Lumbar Spine Complete  Result Date: 11/24/2021 CLINICAL DATA:  Seizure, severe low back pain. EXAM: LUMBAR SPINE - COMPLETE 4+ VIEW COMPARISON:  None Available. FINDINGS: There is an acute compression deformity with a vertical elements at L2 with loss of vertebral body height of approximately 60% a slightly retropulsed element is noted. Mild retrolisthesis at L4-L5. Intervertebral disc space is maintained in the lumbar spine. There is mild intervertebral disc space narrowing with degenerative endplate changes in the thoracic spine. Atherosclerotic calcification of the aorta is noted. IMPRESSION: Acute compression fracture at L2 with  loss of vertebral body height of approximately 60% and slightly retropulsed element. Electronically Signed   By: Brett Fairy M.D.   On: 11/24/2021 22:56   CT HEAD WO CONTRAST (5MM)  Result Date: 11/24/2021 CLINICAL DATA:  New onset seizure EXAM: CT HEAD WITHOUT CONTRAST TECHNIQUE: Contiguous axial images were obtained from the base of the skull through the vertex without intravenous contrast. RADIATION DOSE REDUCTION: This exam was performed according to the departmental dose-optimization program which includes automated exposure control, adjustment of the mA and/or kV according to patient size and/or use of iterative reconstruction technique. COMPARISON:  None Available. FINDINGS: Brain: No acute infarct or hemorrhage. Lateral ventricles and midline structures are unremarkable. No  acute extra-axial fluid collections. No mass effect. Vascular: No hyperdense vessel or unexpected calcification. Skull: Normal. Negative for fracture or focal lesion. Sinuses/Orbits: No acute finding. Other: None. IMPRESSION: 1. No acute intracranial process. Electronically Signed   By: Randa Ngo M.D.   On: 11/24/2021 23:04   CT Lumbar Spine Wo Contrast  Result Date: 11/25/2021 CLINICAL DATA:  Seizure, severe low back pain, L2 compression fracture on x-ray. EXAM: CT LUMBAR SPINE WITHOUT CONTRAST TECHNIQUE: Multidetector CT imaging of the lumbar spine was performed without intravenous contrast administration. Multiplanar CT image reconstructions were also generated. RADIATION DOSE REDUCTION: This exam was performed according to the departmental dose-optimization program which includes automated exposure control, adjustment of the mA and/or kV according to patient size and/or use of iterative reconstruction technique. COMPARISON:  No prior CT, correlation is made with lumbar spine radiographs 11/24/2021 FINDINGS: Segmentation: 5 lumbar type vertebrae. Alignment: No listhesis.  Mild levocurvature. Vertebrae: Acute burst  fracture of the L2 vertebral body, which extends through the anterior, posterior, superior, and inferior cortex but does not appear to involve the posterior elements. There is approximately 65% vertebral body height loss, some of which may have been present prior to this acute fracture. 8 mm retropulsion of the posterosuperior cortex, which causes approximately 40% canal narrowing. No other acute fracture or suspicious osseous lesion. Paraspinal and other soft tissues: Knee aortic atherosclerosis. No acute finding. Disc levels: T12-L1: No significant disc bulge. No spinal canal stenosis or neural foraminal narrowing. L1-L2: Minimal disc bulge. No spinal canal stenosis at the level of the disc space. 8 mm retropulsion of the posterosuperior cortex of L2, which causes at least mild spinal canal stenosis and narrows the lateral recesses. No neural foraminal narrowing. L2-L3: Minimal disc bulge. No spinal canal stenosis or neural foraminal narrowing. L3-L4: No significant disc bulge. No spinal canal stenosis or neural foraminal narrowing. L4-L5: Minimal disc bulge. No spinal canal stenosis or neural foraminal narrowing. L5-S1: No significant disc bulge. No spinal canal stenosis or neural foraminal narrowing. IMPRESSION: 1. Acute burst fracture of L2, which does not appear to involve the posterior elements, with 8 mm retropulsion of the posterosuperior cortex, resulting in 40% spinal canal narrowing just below the L1-L2 level. This may have been superimposed on a more chronic compression deformity. 2. No other spinal canal stenosis or neural foraminal narrowing. Electronically Signed   By: Merilyn Baba M.D.   On: 11/25/2021 00:19    Procedures Procedures    Medications Ordered in ED Medications  HYDROmorphone (DILAUDID) injection 1 mg (1 mg Intravenous Given 11/26/21 0118)  sodium chloride 0.9 % bolus 1,000 mL (1,000 mLs Intravenous New Bag/Given 11/26/21 0036)  HYDROmorphone (DILAUDID) injection 1 mg (1 mg  Intravenous Given 11/26/21 0208)    ED Course/ Medical Decision Making/ A&P                           Medical Decision Making Amount and/or Complexity of Data Reviewed Labs: ordered.  Risk Prescription drug management.   This patient presents to the ED for concern of lower back pain, this involves an extensive number of treatment options, and is a complaint that carries with it a high risk of complications and morbidity.  The differential diagnosis includes spinal cord stenosis, spinal equina, uncontrolled pain    Additional history obtained:  Additional history obtained from wife at bedside External records from outside source obtained and reviewed including previous ER note   Co morbidities that complicate  the patient evaluation  N/A  Social Determinants of Health:  N/A    Lab Tests:  I Ordered, and personally interpreted labs.  The pertinent results include: CBC shows slight leukocytosis of 14.5, glucose 275   Imaging Studies ordered:  I ordered imaging studies including N/A I independently visualized and interpreted imaging which showed N/A I agree with the radiologist interpretation   Cardiac Monitoring:  The patient was maintained on a cardiac monitor.  I personally viewed and interpreted the cardiac monitored which showed an underlying rhythm of: N/A   Medicines ordered and prescription drug management:  I ordered medication including fluids, pain medication I have reviewed the patients home medicines and have made adjustments as needed  Critical Interventions:  N/A   Reevaluation:  Presents with lower back pain, diagnosed with an L2 burst fracture, there is no obvious focal deficits, he is in significant amount of pain in the lower extremities was difficult assessing patient's lower extremities but he is able to move his toes ankle and knee minimally.  Sensation is fully intact, he had good 2+ dorsal pedal pulses.  Will prime with pain medication  continue to monitor.  Patient is reassessed, states he is feeling much better, he is resting comfortably, having no complaints, he is agreement with discharge at this time follow-up with outpatient neurosurgery.    Consultations Obtained:  N/A   Test Considered:  CT lumbar spine-deferred as she has had no trauma to the area no neurological symptoms    Rule out Low suspicion for spinal cord abnormality or spinal equina as he has no red flag symptoms no focal deficit present my exam.  I have low suspicion patient CVA or internal head bleed does not endorse any headache change in vision paresthesia or weakness of her lower extremities no focal deficit present my exam.  I have low suspicion patient in the hospital is due to pain management as his pain is under control and he is agreement for discharge.    Dispostion and problem list  After consideration of the diagnostic results and the patients response to treatment, I feel that the patent would benefit from discharge.  L2 compression burst fracture-patient was discharged home yesterday with oxycodone 5 mg will change this to 10 mg and stressed and have him follow-up with neurosurgery for further evaluation. Seizure-like activity-unclear if he actually had a seizure, he had no seizures present my exam, I explained to him due to [place] law he is not able to drive for the next 6 months, he needs follow-up with neurology for further evaluation.            Final Clinical Impression(s) / ED Diagnoses Final diagnoses:  Closed stable burst fracture of second lumbar vertebra, initial encounter [facility])    Rx / DC Orders ED Discharge Orders          Ordered    oxyCODONE-acetaminophen (PERCOCET) 10-325 MG tablet  Every 8 hours PRN        11/26/21 0220              Marcello Fennel, PA-C 11/26/21 0220    Mesner, Corene Cornea, MD 11/26/21 [phone]

## 2021-11-25 NOTE — ED Provider Triage Note (Signed)
Emergency Medicine Provider Triage Evaluation Note  Colen Eltzroth , a 56 y.o. male  was evaluated in triage.  Pt complains of continued back pain.  Seen and evaluated yesterday after seizure.  CT showed a burst fracture of L2.  He was under the impression that this required admission but he decided that he wanted to go home.  Called the [place] who told him to come back to the ED.  His pain has not been controlled with home medications.  Pain is worse with any movement.  Denies any numbness or weakness.  Review of Systems  Positive: Back pain Negative: Numbness, weakness  Physical Exam  BP (!) 130/92 (BP Location: Right Arm)   Pulse 83   Temp 98.2 F (36.8 C) (Oral)   Resp 16   SpO2 98%  Gen:   Awake, no distress   Resp:  Normal effort  MSK:   Moves extremities without difficulty  Other:  TLSO brace in place  Medical Decision Making  Medically screening exam initiated at 6:54 PM.  Appropriate orders placed.  Precious Gilchrest was informed that the remainder of the evaluation will be completed by another provider, this initial triage assessment does not replace that evaluation, and the importance of remaining in the ED until their evaluation is complete.  Will need medication management   Delia Heady, Hershal Coria 11/25/21 9373

## 2021-11-26 LAB — CBC WITH DIFFERENTIAL/PLATELET
Abs Immature Granulocytes: 0.08 10*3/uL — ABNORMAL HIGH (ref 0.00–0.07)
Basophils Absolute: 0 10*3/uL (ref 0.0–0.1)
Basophils Relative: 0 %
Eosinophils Absolute: 0.1 10*3/uL (ref 0.0–0.5)
Eosinophils Relative: 1 %
HCT: 44.1 % (ref 39.0–52.0)
Hemoglobin: 15.6 g/dL (ref 13.0–17.0)
Immature Granulocytes: 1 %
Lymphocytes Relative: 15 %
Lymphs Abs: 2.1 10*3/uL (ref 0.7–4.0)
MCH: 32.2 pg (ref 26.0–34.0)
MCHC: 35.4 g/dL (ref 30.0–36.0)
MCV: 91.1 fL (ref 80.0–100.0)
Monocytes Absolute: 0.9 10*3/uL (ref 0.1–1.0)
Monocytes Relative: 6 %
Neutro Abs: 11.4 10*3/uL — ABNORMAL HIGH (ref 1.7–7.7)
Neutrophils Relative %: 77 %
Platelets: 253 10*3/uL (ref 150–400)
RBC: 4.84 MIL/uL (ref 4.22–5.81)
RDW: 12.3 % (ref 11.5–15.5)
WBC: 14.5 10*3/uL — ABNORMAL HIGH (ref 4.0–10.5)
nRBC: 0 % (ref 0.0–0.2)

## 2021-11-26 LAB — BASIC METABOLIC PANEL
Anion gap: 10 (ref 5–15)
BUN: 19 mg/dL (ref 6–20)
CO2: 24 mmol/L (ref 22–32)
Calcium: 9.4 mg/dL (ref 8.9–10.3)
Chloride: 102 mmol/L (ref 98–111)
Creatinine, Ser: 0.92 mg/dL (ref 0.61–1.24)
GFR, Estimated: >60 mL/min (ref >60)
Glucose, Bld: 275 mg/dL — ABNORMAL HIGH (ref 70–99)
Potassium: 4.2 mmol/L (ref 3.5–5.1)
Sodium: 136 mmol/L (ref 135–145)

## 2021-11-26 MED ORDER — HYDROMORPHONE HCL 1 MG/ML IJ SOLN
1.0000 mg | Freq: Once | INTRAMUSCULAR | Status: AC
  Administered 2021-11-26: 1 mg via INTRAVENOUS
  Filled 2021-11-26: qty 1

## 2021-11-26 MED ORDER — OXYCODONE-ACETAMINOPHEN 10-325 MG PO TABS: 1.0000 | ORAL_TABLET | Freq: Three times a day (TID) | ORAL | 0 refills | Status: AC | PRN

## 2021-11-26 NOTE — Discharge Instructions (Addendum)
Lumbar fracture-please leave the brace on at all times, I have given you a new pain medication please take as prescribed please discontinue the other pain medication.  It is extremely important they follow-up with neurosurgery  may follow-up with the one that the [place] provide or the one that I have provided you up must follow-up in a week's time. [place] you are not allowed to drive for the next 6 months, you must follow-up with neurology for further evaluation.  I have given you a short course of narcotics please take as prescribed.  This medication can make you drowsy do not consume alcohol or operate heavy machinery when taking this medication.  This medication is Tylenol in it do not take Tylenol and take this medication.     Please come back to the ER if you notice numbness tingling inability to move your legs or have urinary/bowel incontinence he is as you will need further evaluation.

## 2021-12-02 ENCOUNTER — Inpatient Hospital Stay (HOSPITAL_COMMUNITY): Payer: No Typology Code available for payment source

## 2021-12-02 ENCOUNTER — Other Ambulatory Visit: Payer: Self-pay | Admitting: Neurological Surgery

## 2021-12-02 ENCOUNTER — Encounter (HOSPITAL_COMMUNITY): Payer: Self-pay | Admitting: Neurological Surgery

## 2021-12-02 ENCOUNTER — Inpatient Hospital Stay (HOSPITAL_COMMUNITY)
Admission: AD | Admit: 2021-12-02 | Discharge: 2021-12-04 | DRG: 460 | Disposition: A | Discharge status: Home / Self Care | Payer: No Typology Code available for payment source | Attending: Neurological Surgery | Admitting: Neurological Surgery

## 2021-12-02 DIAGNOSIS — S22089A Unspecified fracture of T11-T12 vertebra, initial encounter for closed fracture: Secondary | ICD-10-CM | POA: Diagnosis not present

## 2021-12-02 DIAGNOSIS — S32000A Wedge compression fracture of unspecified lumbar vertebra, initial encounter for closed fracture: Secondary | ICD-10-CM

## 2021-12-02 DIAGNOSIS — S22088A Other fracture of T11-T12 vertebra, initial encounter for closed fracture: Secondary | ICD-10-CM | POA: Diagnosis present

## 2021-12-02 DIAGNOSIS — F17210 Nicotine dependence, cigarettes, uncomplicated: Secondary | ICD-10-CM | POA: Diagnosis present

## 2021-12-02 DIAGNOSIS — E119 Type 2 diabetes mellitus without complications: Secondary | ICD-10-CM | POA: Diagnosis present

## 2021-12-02 DIAGNOSIS — Z79899 Other long term (current) drug therapy: Secondary | ICD-10-CM

## 2021-12-02 DIAGNOSIS — I10 Essential (primary) hypertension: Secondary | ICD-10-CM | POA: Diagnosis not present

## 2021-12-02 DIAGNOSIS — Z85828 Personal history of other malignant neoplasm of skin: Secondary | ICD-10-CM

## 2021-12-02 DIAGNOSIS — S32022A Unstable burst fracture of second lumbar vertebra, initial encounter for closed fracture: Principal | ICD-10-CM | POA: Diagnosis present

## 2021-12-02 DIAGNOSIS — X58XXXA Exposure to other specified factors, initial encounter: Secondary | ICD-10-CM | POA: Diagnosis present

## 2021-12-02 DIAGNOSIS — S32048A Other fracture of fourth lumbar vertebra, initial encounter for closed fracture: Secondary | ICD-10-CM | POA: Diagnosis not present

## 2021-12-02 DIAGNOSIS — Z981 Arthrodesis status: Secondary | ICD-10-CM

## 2021-12-02 HISTORY — DX: Wedge compression fracture of unspecified lumbar vertebra, initial encounter for closed fracture: S32.000A

## 2021-12-02 LAB — SURGICAL PCR SCREEN
MRSA, PCR: NEGATIVE
Staphylococcus aureus: POSITIVE — AB

## 2021-12-02 LAB — PROTIME-INR
INR: 1.1 (ref 0.8–1.2)
Prothrombin Time: 14.2 seconds (ref 11.4–15.2)

## 2021-12-02 LAB — GLUCOSE, CAPILLARY: Glucose-Capillary: 186 mg/dL — ABNORMAL HIGH (ref 70–99)

## 2021-12-02 IMAGING — MR MR LUMBAR SPINE W/O CM
4 of 5 series · 18 of 48 positions shown · non-contrast
Comparison: [DATE] CT scan

CLINICAL DATA: L2 compression fracture and like weakness.

EXAM:
MRI LUMBAR SPINE WITHOUT CONTRAST
TECHNIQUE: Multiplanar, multisequence MR imaging of the lumbar spine was
performed. No intravenous contrast was administered.

[Series 2: T2 · sagittal · 4.0mm · 0.55mm/px · 5 of 17 slices shown (1 of 2)]
[im 1/17]
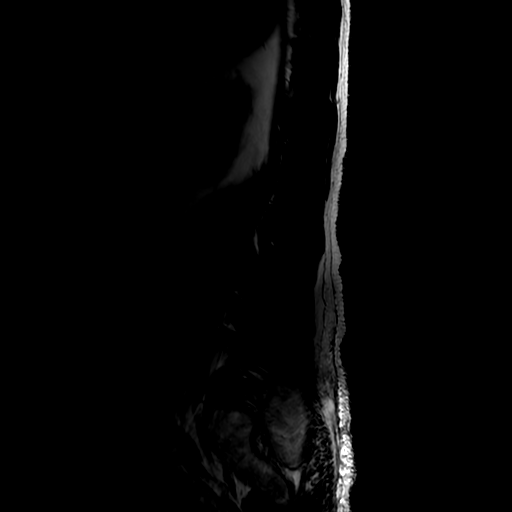
[im 5/17]
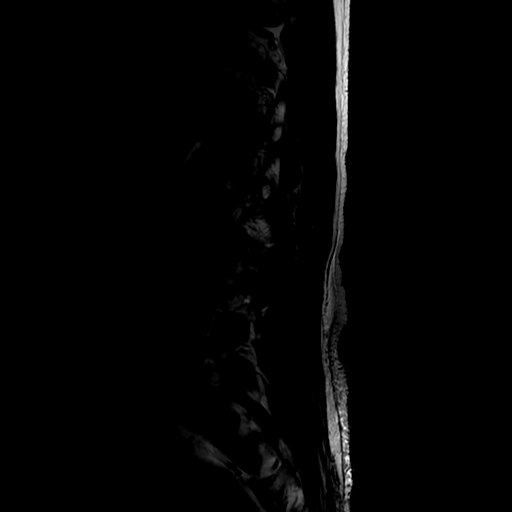
[im 9/17]
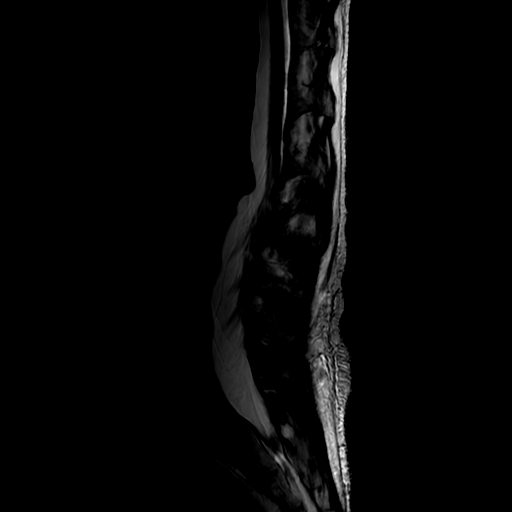
[im 13/17]
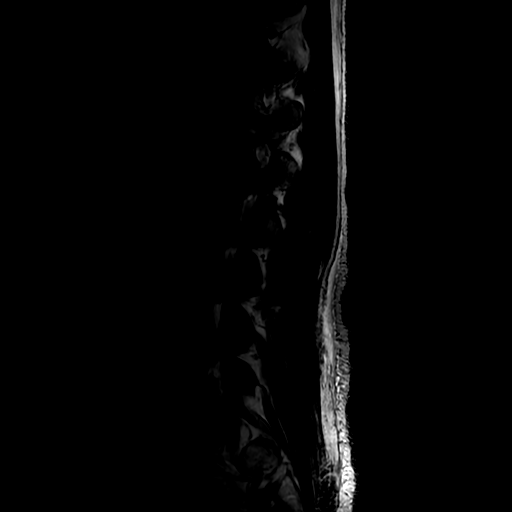
[im 17/17]
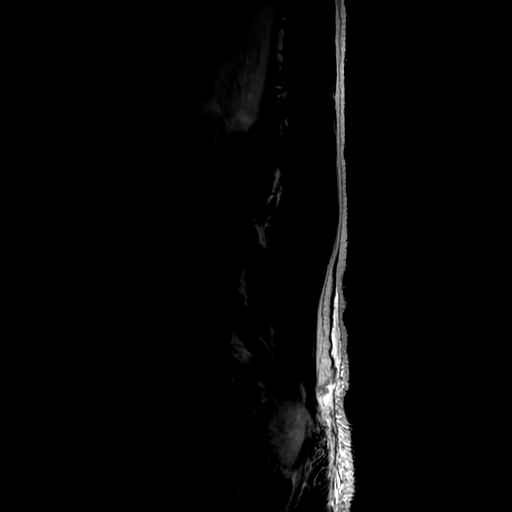

[Series 4: T1 · sagittal · 4.0mm · 0.55mm/px · 3 of 17 slices shown (1 of 2)]
[im 4/17]
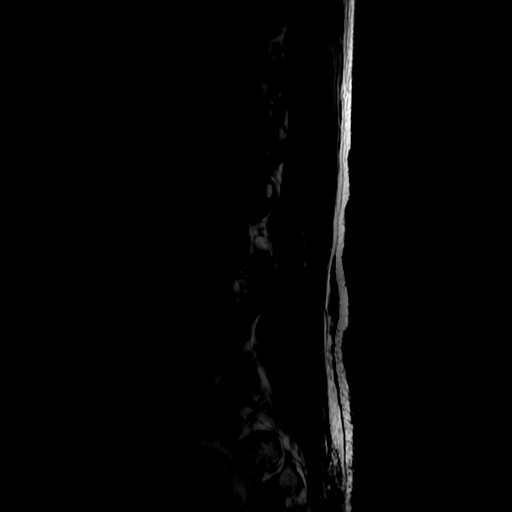
[im 10/17]
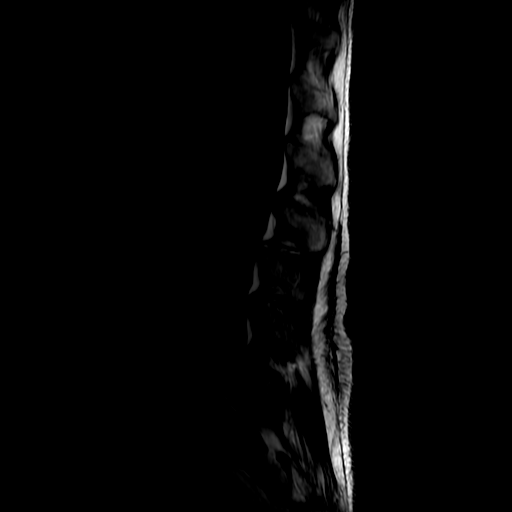
[im 17/17]
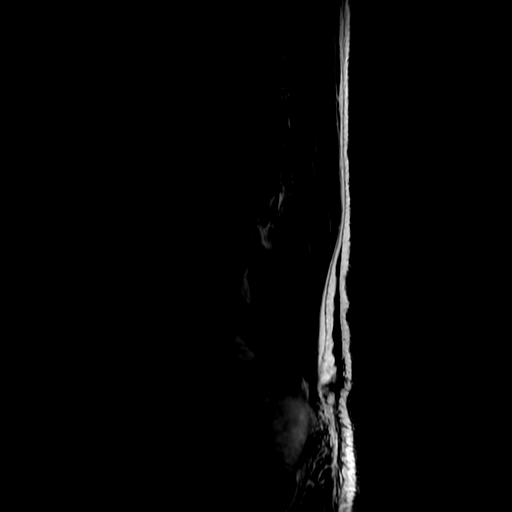

[Series 5: T2 · axial · 4.0mm · 0.39mm/px · z∈[-104,+80]mm · 7 of 48 slices shown (2 of 2)]
[im 4/48]
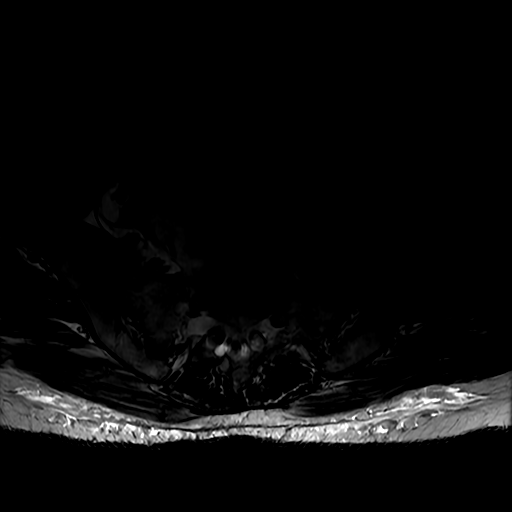
[im 7/48]
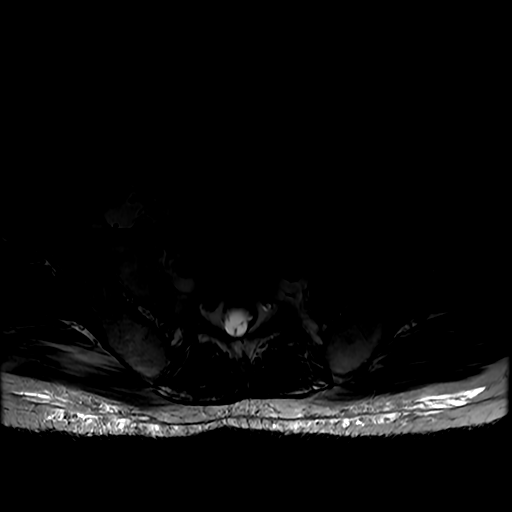
[im 10/48]
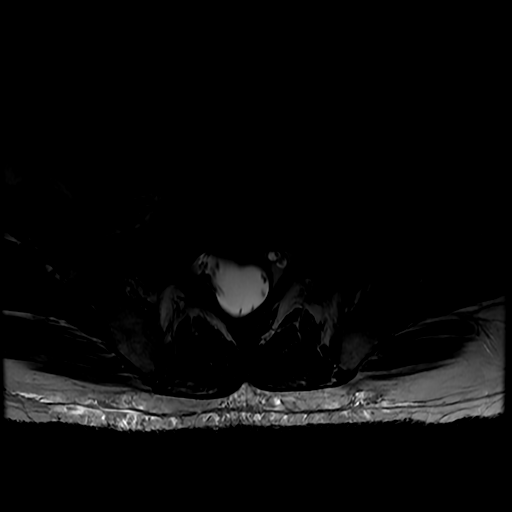
[im 16/48]
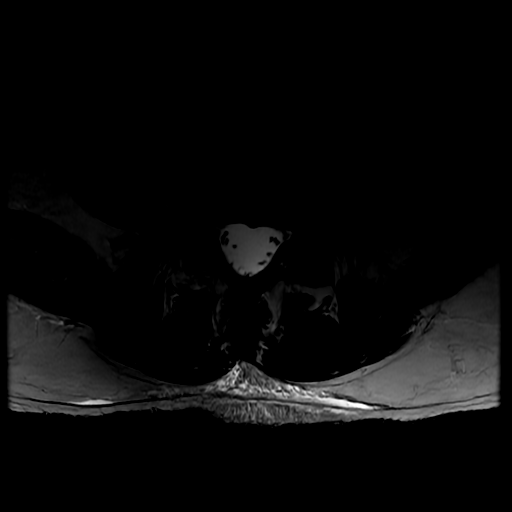
[im 22/48]
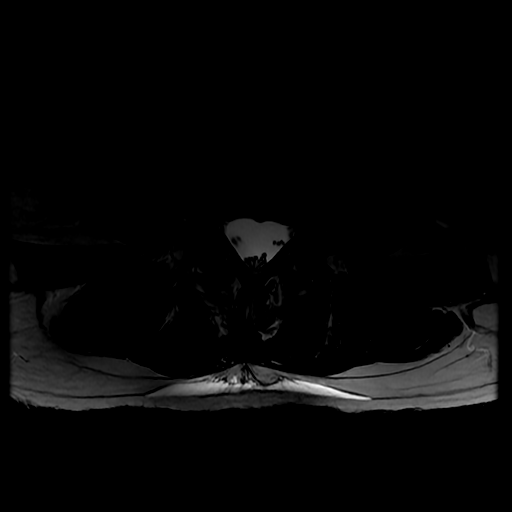
[im 26/48]
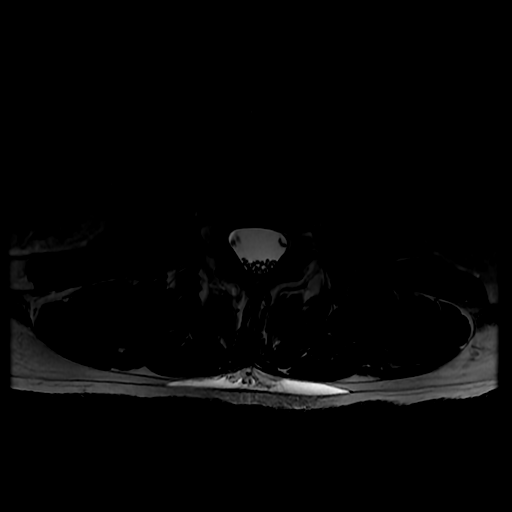
[im 41/48]
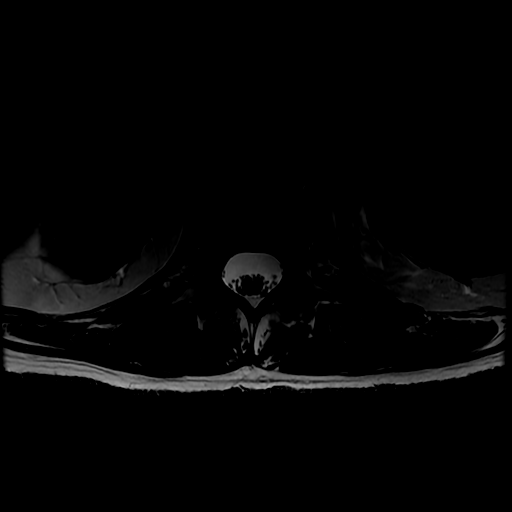

[Series 6: T1 · axial · 4.0mm · 0.39mm/px · z∈[-89,+80]mm · 3 of 48 slices shown (2 of 2)]
[im 7/48]
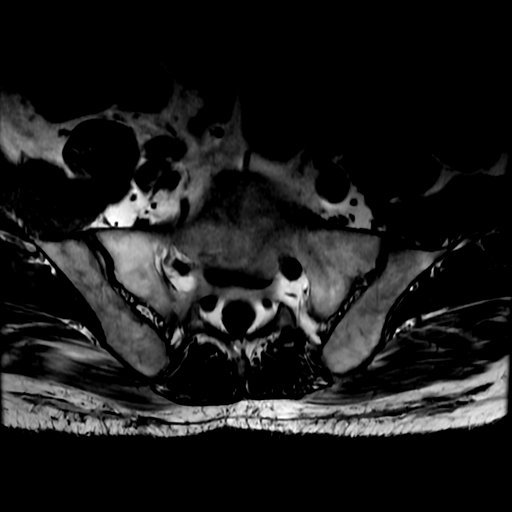
[im 26/48]
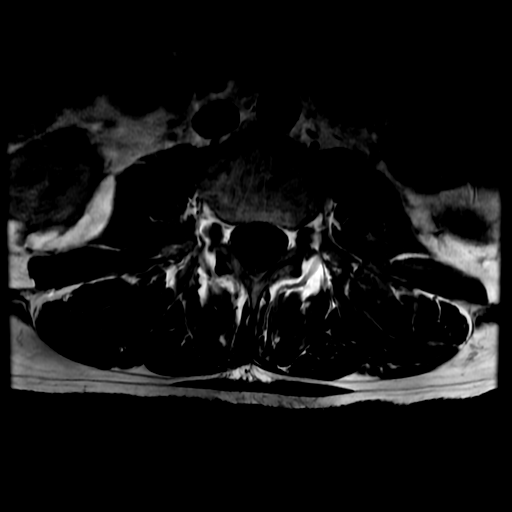
[im 41/48]
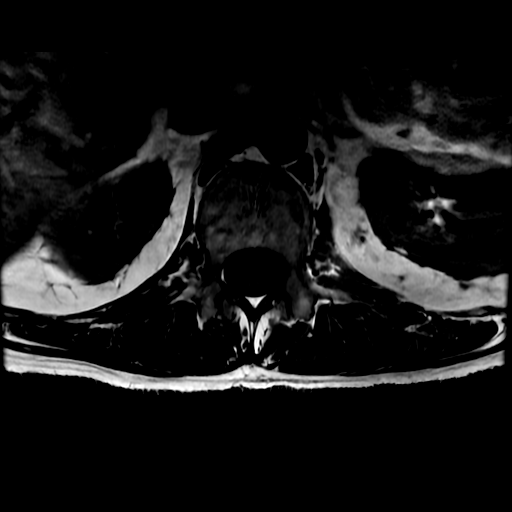

[18 of 48 positions shown; findings below may reference images not displayed]

FINDINGS: Segmentation: The lowest lumbar type non-rib-bearing vertebra is
labeled as L5.

Alignment:  No vertebral subluxation is observed.

Vertebrae: As noted on prior CT, there is an L2 burst fracture with
9 mm posterior bony retropulsion as shown on image 10 series 2, and
a proximally 60% loss of vertebral body height. There is diffuse
edema signal in the L2 vertebral body.

In addition, there is a subtle superior endplate compression
fracture at L4 with 10% loss of vertebral body heights, and marrow
edema along the superior endplate compression. The fracture extends
to the middle column/posterior vertebral body margin although there
is no posterior bony retropulsion.

In addition, there is a transverse fracture through the upper
portion of the T12 vertebral body. This likewise extends to the
posterior vertebral margin and accordingly involves the anterior and
middle columns. There is about 10% loss of vertebral body height at
T12.

Conus medullaris and cauda equina: Conus extends to the L2 level.
Conus and cauda equina appear normal.

Paraspinal and other soft tissues: The compression fractures in the
lower thoracic and lumbar spine orally associated with subtle
paraspinal edema.

Disc levels:

T11-12: No impingement. This level is only included on the
parasagittal images.

T12-L1: Unremarkable

L1-2: Borderline left subarticular lateral recess stenosis on image
15 series 5. At the level of maximum posterior bony retropulsion,
the AP diameter of the thecal sac is 1.1 cm.

L2-3: Unremarkable

L3-4: Unremarkable

L4-5: No impingement. Mild disc bulge. Small left foraminal annular
tear.

L5-S1: No impingement.  Minimal disc bulge.
IMPRESSION: 1. Subacute L2 burst fracture with 9 mm posterior bony retropulsion
and about 60% loss of vertebral body height. The posterior bony
retropulsion is associated with equivocal left lateral recess
stenosis just below the L1-2 level.
2. Subacute superior endplate compression fracture at L4 involving
the anterior and middle columns, without substantial bony
retropulsion. 10% loss of vertebral height.
3. Subacute transverse upper vertebral fracture at T12 involvement
of the anterior and middle columns, no retropulsion. 10% loss of
vertebral height.

## 2021-12-02 MED ORDER — PHENOL 1.4 % MT LIQD: 1.0000 | OROMUCOSAL | Status: DC | PRN

## 2021-12-02 MED ORDER — DIVALPROEX SODIUM ER 500 MG PO TB24
1000.0000 mg | ORAL_TABLET | Freq: Every morning | ORAL | Status: DC
Start: 2021-12-03 — End: 2021-12-03

## 2021-12-02 MED ORDER — METOPROLOL TARTRATE 12.5 MG HALF TABLET
12.5000 mg | ORAL_TABLET | Freq: Two times a day (BID) | ORAL | Status: DC
  Administered 2021-12-03: 12.5 mg via ORAL
  Filled 2021-12-02 (×2): qty 1

## 2021-12-02 MED ORDER — POTASSIUM CHLORIDE IN NACL 20-0.9 MEQ/L-% IV SOLN
INTRAVENOUS | Status: DC
  Filled 2021-12-02: qty 1000

## 2021-12-02 MED ORDER — DEXAMETHASONE 4 MG PO TABS
4.0000 mg | ORAL_TABLET | Freq: Four times a day (QID) | ORAL | Status: DC
  Administered 2021-12-02: 4 mg via ORAL
  Filled 2021-12-02: qty 1

## 2021-12-02 MED ORDER — DEXAMETHASONE SODIUM PHOSPHATE 4 MG/ML IJ SOLN
4.0000 mg | Freq: Four times a day (QID) | INTRAMUSCULAR | Status: DC
  Administered 2021-12-03 (×3): 4 mg via INTRAVENOUS
  Filled 2021-12-02 (×3): qty 1

## 2021-12-02 MED ORDER — LISINOPRIL 2.5 MG PO TABS: 2.5000 mg | ORAL_TABLET | Freq: Every day | ORAL | Status: DC

## 2021-12-02 MED ORDER — ONDANSETRON HCL 4 MG PO TABS: 4.0000 mg | ORAL_TABLET | Freq: Four times a day (QID) | ORAL | Status: DC | PRN

## 2021-12-02 MED ORDER — GABAPENTIN 300 MG PO CAPS
300.0000 mg | ORAL_CAPSULE | ORAL | Status: AC
  Administered 2021-12-03: 300 mg via ORAL
  Filled 2021-12-02: qty 1

## 2021-12-02 MED ORDER — SENNA 8.6 MG PO TABS
1.0000 | ORAL_TABLET | Freq: Two times a day (BID) | ORAL | Status: DC
  Administered 2021-12-02: 8.6 mg via ORAL
  Filled 2021-12-02: qty 1

## 2021-12-02 MED ORDER — SODIUM CHLORIDE 0.9% FLUSH: 3.0000 mL | INTRAVENOUS | Status: DC | PRN

## 2021-12-02 MED ORDER — CHLORHEXIDINE GLUCONATE CLOTH 2 % EX PADS
6.0000 | MEDICATED_PAD | Freq: Every day | CUTANEOUS | Status: DC
  Administered 2021-12-03: 6 via TOPICAL

## 2021-12-02 MED ORDER — INSULIN ASPART 100 UNIT/ML IJ SOLN
0.0000 [IU] | Freq: Three times a day (TID) | INTRAMUSCULAR | Status: DC
  Administered 2021-12-03 (×3): 5 [IU] via SUBCUTANEOUS

## 2021-12-02 MED ORDER — DIVALPROEX SODIUM ER 500 MG PO TB24: 1000.0000 mg | ORAL_TABLET | ORAL | Status: DC

## 2021-12-02 MED ORDER — MENTHOL 3 MG MT LOZG: 1.0000 | LOZENGE | OROMUCOSAL | Status: DC | PRN

## 2021-12-02 MED ORDER — ACETAMINOPHEN 500 MG PO TABS
1000.0000 mg | ORAL_TABLET | Freq: Four times a day (QID) | ORAL | Status: AC
  Administered 2021-12-02 – 2021-12-03 (×3): 1000 mg via ORAL
  Filled 2021-12-02 (×3): qty 2

## 2021-12-02 MED ORDER — MUPIROCIN 2 % EX OINT
1.0000 "application " | TOPICAL_OINTMENT | Freq: Two times a day (BID) | CUTANEOUS | Status: DC
  Administered 2021-12-03 (×2): 1 via NASAL
  Filled 2021-12-02 (×2): qty 22

## 2021-12-02 MED ORDER — HYDROMORPHONE HCL 1 MG/ML IJ SOLN
0.5000 mg | INTRAMUSCULAR | Status: DC | PRN
  Administered 2021-12-03: 0.5 mg via INTRAVENOUS
  Filled 2021-12-02: qty 0.5

## 2021-12-02 MED ORDER — DIVALPROEX SODIUM ER 500 MG PO TB24
1500.0000 mg | ORAL_TABLET | Freq: Every day | ORAL | Status: DC
  Administered 2021-12-02: 1500 mg via ORAL
  Filled 2021-12-02: qty 3

## 2021-12-02 MED ORDER — SODIUM CHLORIDE 0.9 % IV SOLN: 250.0000 mL | INTRAVENOUS | Status: DC

## 2021-12-02 MED ORDER — SODIUM CHLORIDE 0.9% FLUSH
3.0000 mL | Freq: Two times a day (BID) | INTRAVENOUS | Status: DC
  Administered 2021-12-02: 3 mL via INTRAVENOUS

## 2021-12-02 MED ORDER — LURASIDONE HCL 40 MG PO TABS
40.0000 mg | ORAL_TABLET | Freq: Every evening | ORAL | Status: DC
  Administered 2021-12-02: 40 mg via ORAL
  Filled 2021-12-02 (×2): qty 1

## 2021-12-02 MED ORDER — OXYCODONE HCL 5 MG PO TABS: 5.0000 mg | ORAL_TABLET | ORAL | Status: DC | PRN

## 2021-12-02 MED ORDER — ONDANSETRON HCL 4 MG/2ML IJ SOLN: 4.0000 mg | Freq: Four times a day (QID) | INTRAMUSCULAR | Status: DC | PRN

## 2021-12-02 MED ORDER — DONEPEZIL HCL 10 MG PO TABS
10.0000 mg | ORAL_TABLET | Freq: Every day | ORAL | Status: DC
  Administered 2021-12-02: 10 mg via ORAL
  Filled 2021-12-02: qty 1

## 2021-12-02 NOTE — Progress Notes (Signed)
Per pt wife, pt should be admitted into hospital tonight. Pre-op instructions given in case pt room is not ready tonight.   -------------  SDW INSTRUCTIONS:  Your procedure is scheduled on 5/31 Please report to [facility] Main Entrance "A" at 1040 A.M., and check in at the Admitting office. Call this number if you have problems the morning of surgery: [phone]   Remember: Do not eat after midnight the night before your surgery  You may drink clear liquids until 10:00 the morning of your surgery.   Clear liquids allowed are: Water, Non-Citrus Juices (without pulp), Carbonated Beverages, Clear Tea, Black Coffee Only, and Gatorade   Medications to take morning of surgery with a sip of water include: (Pt does not take any meds at home currently per pt wife)   As of today, STOP taking any Aspirin (unless otherwise instructed by your surgeon), Aleve, Naproxen, Ibuprofen, Motrin, Advil, Goody's, BC's, all herbal medications, fish oil, and all vitamins.    The Morning of Surgery Do not wear jewelry Do not wear lotions, powders, colognes, or deodorant Do not bring valuables to the hospital. [facility] is not responsible for any belongings or valuables.  If you are a smoker, DO NOT Smoke 24 hours prior to surgery  If you wear a CPAP at night please bring your mask the morning of surgery   Remember that you must have someone to transport you home after your surgery, and remain with you for 24 hours if you are discharged the same day.  Please bring cases for contacts, glasses, hearing aids, dentures or bridgework because it cannot be worn into surgery.   Patients discharged the day of surgery will not be allowed to drive home.   Please shower the NIGHT BEFORE/MORNING OF SURGERY (use antibacterial soap like DIAL soap if possible). Wear comfortable clothes the morning of surgery. Oral Hygiene is also important to reduce your risk of infection.  Remember - BRUSH YOUR TEETH THE MORNING OF  SURGERY WITH YOUR REGULAR TOOTHPASTE  Patient denies shortness of breath, fever, cough and chest pain.

## 2021-12-02 NOTE — H&P (Signed)
Subjective: Patient is a 56 y.o. male admitted for L2 fracture with leg weakness. Onset of symptoms was 1 week ago, gradually worsening since that time.  The pain is rated severe, and is located at the across the lower back and radiates to legs. The pain is described as aching and occurs all day. The symptoms have been progressive. Symptoms are exacerbated by standing. MRI or CT showed L2 burst frcature   Past Medical History:  Diagnosis Date   Diabetes mellitus without complication ([place])    Skin cancer     Past Surgical History:  Procedure Laterality Date   DENTAL SURGERY  04/12/2017   14 teeth removed   DENTAL SURGERY  08/2017   removed remaining 9 teeth   IRRIGATION AND DEBRIDEMENT ABSCESS  04/09/2017   neck - necrotizing fasciitis    Prior to Admission medications   Medication Sig Start Date End Date Taking? Authorizing Provider  Carboxymethylcellulose Sodium 0.25 % SOLN Place 1 drop into both eyes in the morning and at bedtime.    [provider]  clobetasol cream (TEMOVATE) 6.83 % Apply 1 application. topically 2 (two) times daily as needed (rash).    [provider]  diclofenac Sodium (VOLTAREN) 1 % GEL Apply 4 g topically 3 (three) times daily as needed (pain).    [provider]  divalproex (DEPAKOTE ER) 500 MG 24 hr tablet Take 1,000-1,500 mg by mouth See admin instructions. Take 1000 mg by mouth in the morning and take 1500 mg at night    [provider]  donepezil (ARICEPT) 10 MG tablet Take 10 mg by mouth at bedtime.    [provider]  Emollient (CETAPHIL) cream Apply topically as needed.    [provider]  hydrOXYzine (VISTARIL) 50 MG capsule Take 50 mg by mouth 2 (two) times daily as needed for anxiety.    [provider]  lidocaine (LIDODERM) 5 % Place 1 patch onto the skin 2 (two) times daily as needed (pain). Remove & Discard patch within 12 hours or as directed by MD    [provider]  lisinopril  (ZESTRIL) 2.5 MG tablet Take 2.5 mg by mouth daily.    [provider]  lurasidone (LATUDA) 40 MG TABS tablet Take 40 mg by mouth every evening.    [provider]  methocarbamol (ROBAXIN) 500 MG tablet Take 1 tablet (500 mg total) by mouth 2 (two) times daily. Patient taking differently: Take 500 mg by mouth 2 (two) times daily as needed for muscle spasms. 11/25/21   [name], PA-C  metoprolol tartrate (LOPRESSOR) 25 MG tablet Take 12.5 mg by mouth 2 (two) times daily.    [provider]  rizatriptan (MAXALT) 5 MG tablet Take 5 mg by mouth as needed for migraine. May repeat in 2 hours if needed    [provider]  rosuvastatin (CRESTOR) 40 MG tablet Take 40 mg by mouth daily.    [provider]  SEMAGLUTIDE, 1 MG/DOSE, [place] Inject 1 mg into the skin every 7 (seven) days.    [provider]   No Known Allergies  Social History   Tobacco Use   Smoking status: Every Day    Packs/day: 1.00    Types: Cigarettes   Smokeless tobacco: Never  Substance Use Topics   Alcohol use: Yes    Alcohol/week: 1.0 standard drink    Types: 1 Glasses of wine per week    History reviewed. No pertinent family history.  Review of Systems  Positive ROS: neg  All other systems have been reviewed and were otherwise negative with the exception of those mentioned in the HPI and as above.  Objective: Vital signs in last 24 hours: Weight:  [70.3 kg] 70.3 kg (05/30 1628)  General Appearance: Alert, cooperative, no distress, appears stated age Head: Normocephalic, without obvious abnormality, atraumatic Eyes: PERRL, conjunctiva/corneas clear, EOM's intact    Neck: Supple, symmetrical, trachea midline Back: Symmetric, no curvature, ROM normal, no CVA tenderness Lungs:  respirations unlabored Heart: Regular rate and rhythm Abdomen: Soft, non-tender Extremities: Extremities normal, atraumatic, no cyanosis or edema Pulses: 2+ and symmetric all  extremities Skin: Skin color, texture, turgor normal, no rashes or lesions  NEUROLOGIC:   Mental status: Alert and oriented x4,  no aphasia, good attention span, fund of knowledge, and memory Motor Exam - grossly normal in U, 1-2/5 proximal LE strength Sensory Exam - grossly normal Reflexes: decreased Coordination - grossly normal Gait - unable to test Balance - unable to test Cranial Nerves: I: smell Not tested  II: visual acuity  OS: nl    OD: nl  II: visual fields Full to confrontation  II: pupils Equal, round, reactive to light  III,VII: ptosis None  III,IV,VI: extraocular muscles  Full ROM  V: mastication Normal  V: facial light touch sensation  Normal  V,VII: corneal reflex  Present  VII: facial muscle function - upper  Normal  VII: facial muscle function - lower Normal  VIII: hearing Not tested  IX: soft palate elevation  Normal  IX,X: gag reflex Present  XI: trapezius strength  5/5  XI: sternocleidomastoid strength 5/5  XI: neck flexion strength  5/5  XII: tongue strength  Normal    Data Review Lab Results  Component Value Date   WBC 14.5 (H) 11/26/2021   HGB 15.6 11/26/2021   HCT 44.1 11/26/2021   MCV 91.1 11/26/2021   PLT 253 11/26/2021   Lab Results  Component Value Date   NA 136 11/26/2021   K 4.2 11/26/2021   CL 102 11/26/2021   CO2 24 11/26/2021   BUN 19 11/26/2021   CREATININE 0.92 11/26/2021   GLUCOSE 275 (H) 11/26/2021   No results found for: INR, PROTIME  Assessment/Plan:  Estimated body mass index is 23.57 kg/m as calculated from the following:   Height as of an earlier encounter on 12/02/21: '5\' 8"'$  (1.727 m).   Weight as of an earlier encounter on 12/02/21: 70.3 kg. Patient admitted for L2 fracture. Patient has failed a reasonable attempt at conservative therapy.  I explained the condition and procedure to the patient and answered any questions.  Patient wishes to proceed with procedure as planned. Understands risks/ benefits and typical  outcomes of procedure.  Rec ORIF L2 fracture tomorrow, MRI L-spine tonight   Cody Berger 12/02/2021 6:47 PM

## 2021-12-03 ENCOUNTER — Inpatient Hospital Stay (HOSPITAL_COMMUNITY): Payer: No Typology Code available for payment source | Admitting: Anesthesiology

## 2021-12-03 ENCOUNTER — Inpatient Hospital Stay (HOSPITAL_COMMUNITY): Payer: No Typology Code available for payment source

## 2021-12-03 ENCOUNTER — Inpatient Hospital Stay (HOSPITAL_COMMUNITY)
Admission: RE | Admit: 2021-12-03 | Payer: No Typology Code available for payment source | Source: Ambulatory Visit | Admitting: Neurological Surgery

## 2021-12-03 ENCOUNTER — Encounter (HOSPITAL_COMMUNITY)
Admission: AD | Disposition: A | Discharge status: Home / Self Care | Payer: Self-pay | Source: Home / Self Care | Attending: Neurological Surgery

## 2021-12-03 ENCOUNTER — Encounter (HOSPITAL_COMMUNITY): Payer: Self-pay | Admitting: Neurological Surgery

## 2021-12-03 ENCOUNTER — Other Ambulatory Visit: Payer: Self-pay

## 2021-12-03 DIAGNOSIS — I10 Essential (primary) hypertension: Secondary | ICD-10-CM

## 2021-12-03 DIAGNOSIS — S32048A Other fracture of fourth lumbar vertebra, initial encounter for closed fracture: Secondary | ICD-10-CM

## 2021-12-03 DIAGNOSIS — S32022A Unstable burst fracture of second lumbar vertebra, initial encounter for closed fracture: Secondary | ICD-10-CM

## 2021-12-03 DIAGNOSIS — S22089A Unspecified fracture of T11-T12 vertebra, initial encounter for closed fracture: Secondary | ICD-10-CM

## 2021-12-03 DIAGNOSIS — Z981 Arthrodesis status: Secondary | ICD-10-CM

## 2021-12-03 HISTORY — PX: LAMINECTOMY WITH POSTERIOR LATERAL ARTHRODESIS LEVEL 4: SHX6338

## 2021-12-03 LAB — GLUCOSE, CAPILLARY
Glucose-Capillary: 250 mg/dL — ABNORMAL HIGH (ref 70–99)
Glucose-Capillary: 201 mg/dL — ABNORMAL HIGH (ref 70–99)
Glucose-Capillary: 169 mg/dL — ABNORMAL HIGH (ref 70–99)
Glucose-Capillary: 201 mg/dL — ABNORMAL HIGH (ref 70–99)
Glucose-Capillary: 218 mg/dL — ABNORMAL HIGH (ref 70–99)

## 2021-12-03 LAB — ABO/RH: ABO/RH(D): AB POS

## 2021-12-03 LAB — HEMOGLOBIN A1C
Hgb A1c MFr Bld: 7.8 % — ABNORMAL HIGH (ref 4.8–5.6)
Mean Plasma Glucose: 177.16 mg/dL

## 2021-12-03 LAB — CBC
HCT: 42.4 % (ref 39.0–52.0)
Hemoglobin: 14.8 g/dL (ref 13.0–17.0)
MCH: 32.7 pg (ref 26.0–34.0)
MCHC: 34.9 g/dL (ref 30.0–36.0)
MCV: 93.6 fL (ref 80.0–100.0)
Platelets: 322 10*3/uL (ref 150–400)
RBC: 4.53 MIL/uL (ref 4.22–5.81)
RDW: 12 % (ref 11.5–15.5)
WBC: 9.2 10*3/uL (ref 4.0–10.5)
nRBC: 0 % (ref 0.0–0.2)

## 2021-12-03 LAB — TYPE AND SCREEN
ABO/RH(D): AB POS
Antibody Screen: NEGATIVE

## 2021-12-03 IMAGING — MR MR THORACIC SPINE W/O CM
4 of 6 series · 19 of 48 positions shown · non-contrast
Comparison: None Available.

CLINICAL DATA: Leg weakness, lumbar spine fracture

EXAM:
MRI THORACIC SPINE WITHOUT CONTRAST
TECHNIQUE: Multiplanar, multisequence MR imaging of the thoracic spine was
performed. No intravenous contrast was administered.

[Series 2: T1 · sagittal · 3.0mm · 0.90mm/px · 3 of 12 slices shown (1 of 2)]
[im 1/12]
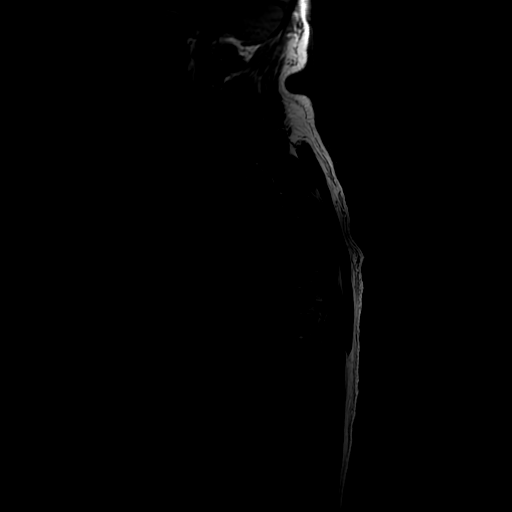
[im 6/12]
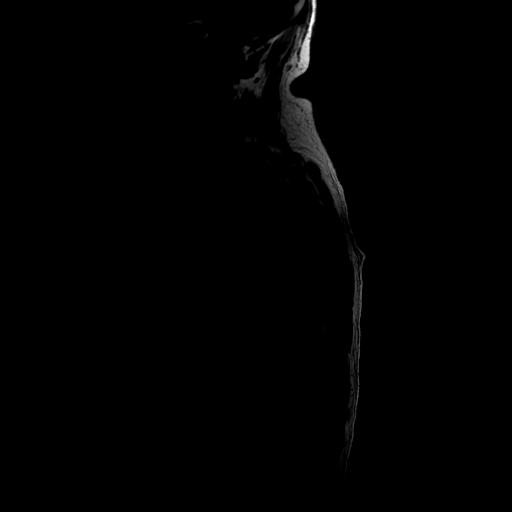
[im 12/12]
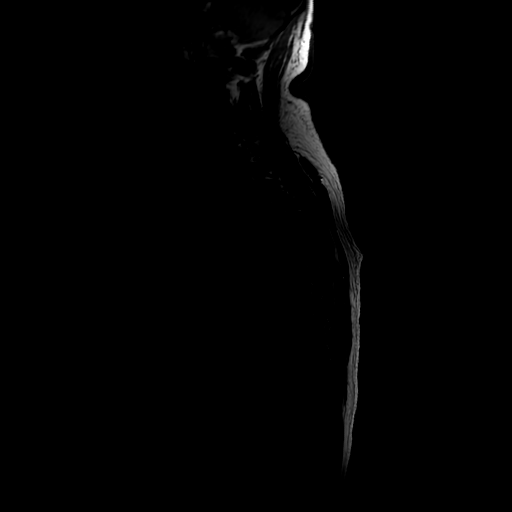

[Series 3: T2 · sagittal · 3.0mm · 0.66mm/px · 5 of 15 slices shown (1 of 2)]
[im 1/15]
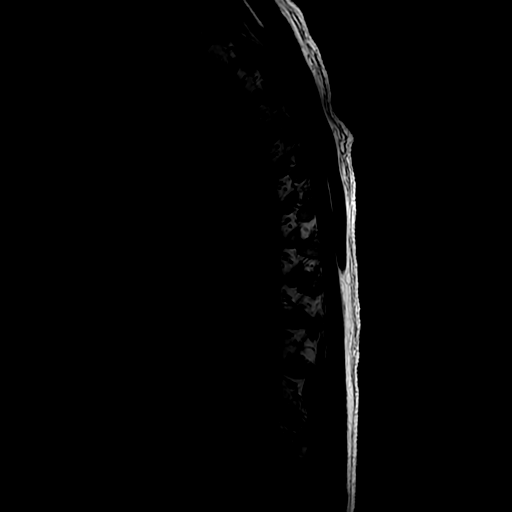
[im 4/15]
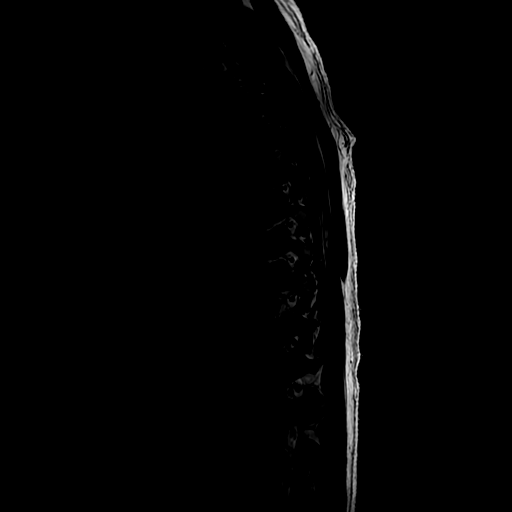
[im 8/15]
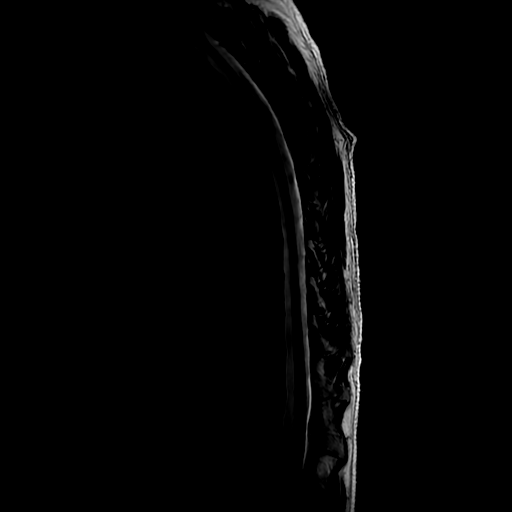
[im 11/15]
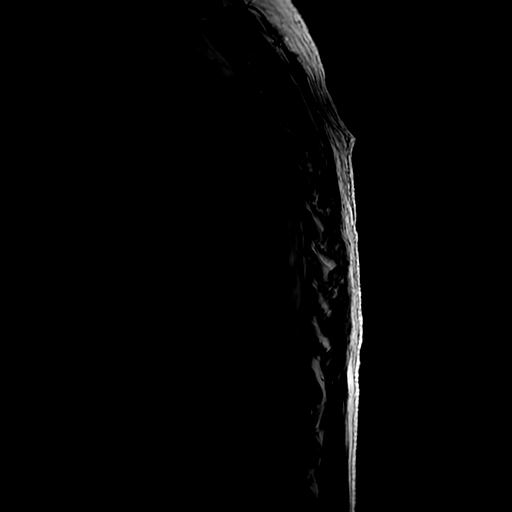
[im 15/15]
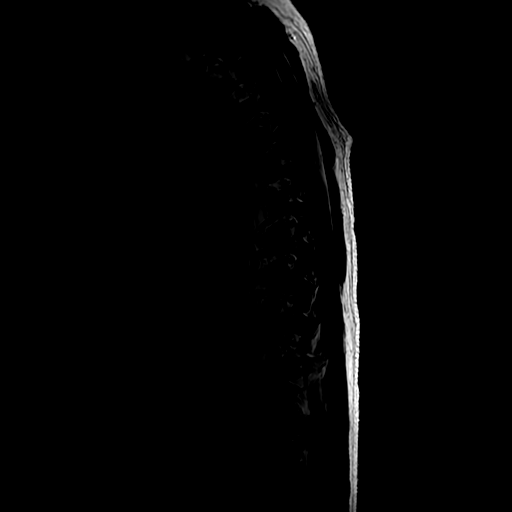

[Series 5: T1 · sagittal · 3.0mm · 0.66mm/px · 3 of 15 slices shown (2 of 2)]
[im 1/15]
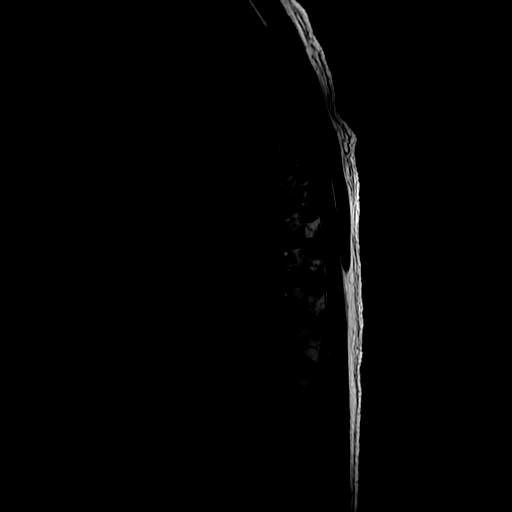
[im 8/15]
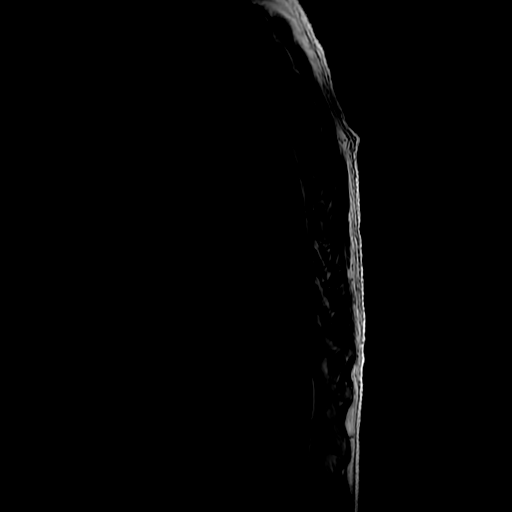
[im 15/15]
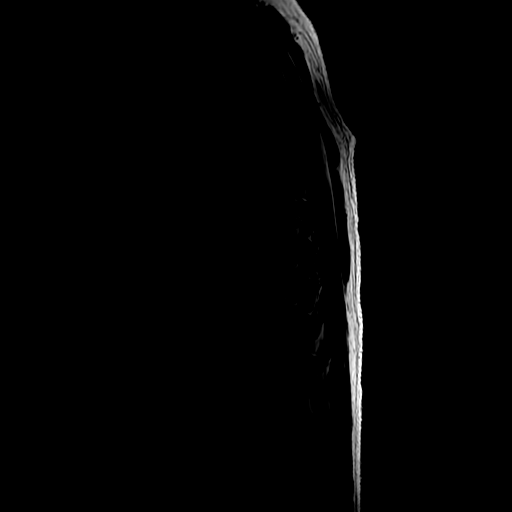

[Series 6: T2 · axial · 4.0mm · 0.39mm/px · z∈[-206,+17]mm · 8 of 39 slices shown (2 of 2)]
[im 1/39]
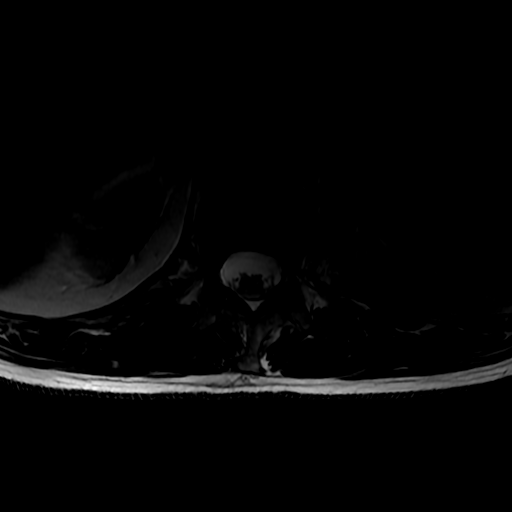
[im 6/39]
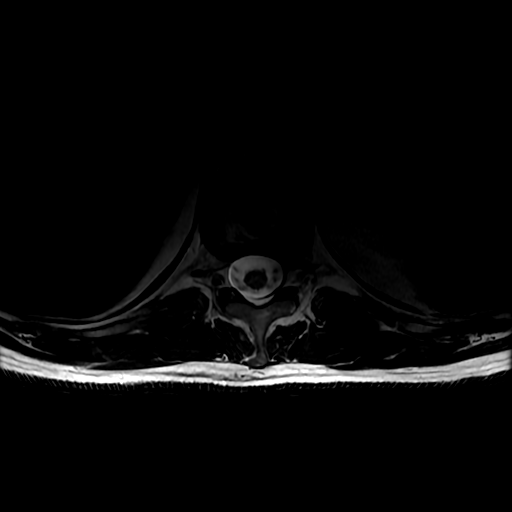
[im 12/39]
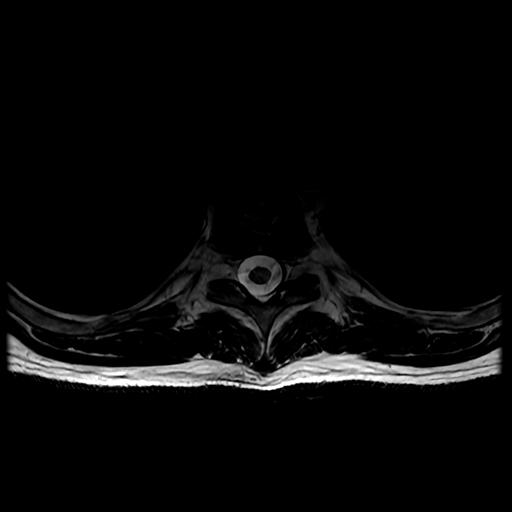
[im 18/39]
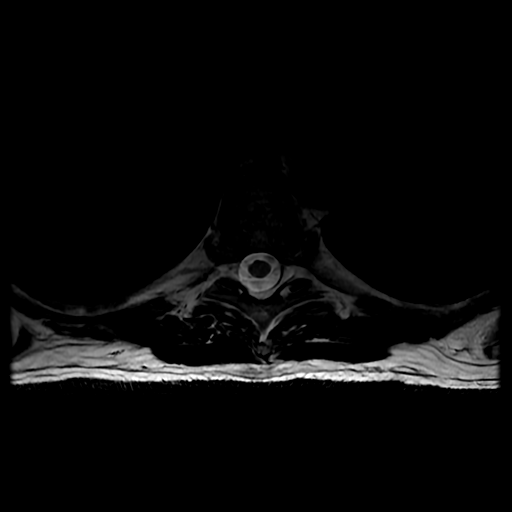
[im 21/39]
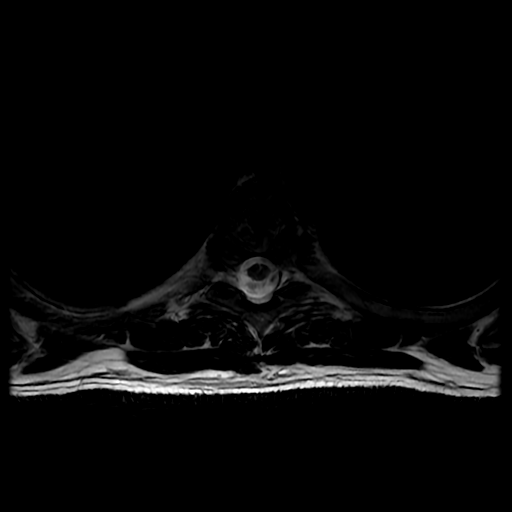
[im 27/39]
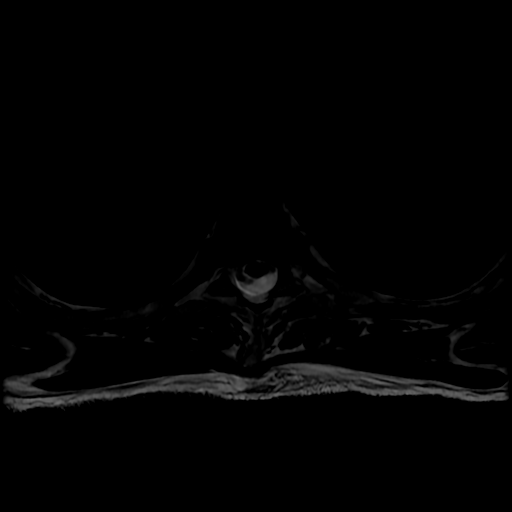
[im 33/39]
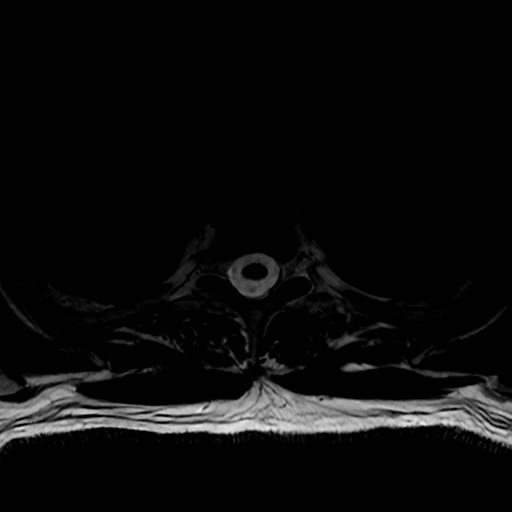
[im 39/39]
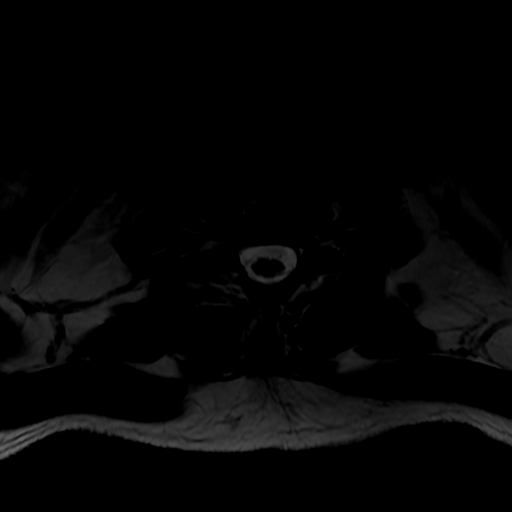

[19 of 48 positions shown; findings below may reference images not displayed]

FINDINGS: Alignment:  No significant listhesis.

Vertebrae: T12 fracture as identified on prior imaging. Fracture
line underlies the superior endplate with STIR hyperintensity but no
substantial surrounding edema. Height loss is minor. Chronic mild T4
compression fracture with anterior wedging. There is also mild loss
of height at additional levels with superimposed degenerative
endplate irregularity and mild degenerative endplate marrow edema.

Cord:  No abnormal signal.

Paraspinal and other soft tissues: Unremarkable.

Disc levels: Minor degenerative disc disease. For example trace disc
bulges at T3-T4 and T4-T5. There is mild facet arthropathy. No canal
or foraminal stenosis at any level.
IMPRESSION: Likely subacute T12 fracture as identified previously. No additional
acute or subacute thoracic spine fracture.

## 2021-12-03 SURGERY — LAMINECTOMY WITH POSTERIOR LATERAL ARTHRODESIS LEVEL 4
Anesthesia: General

## 2021-12-03 MED ORDER — PROPOFOL 10 MG/ML IV BOLUS
INTRAVENOUS | Status: DC | PRN
  Administered 2021-12-03: 100 mg via INTRAVENOUS

## 2021-12-03 MED ORDER — SODIUM CHLORIDE 0.9% FLUSH
3.0000 mL | Freq: Two times a day (BID) | INTRAVENOUS | Status: DC
  Administered 2021-12-03 – 2021-12-04 (×2): 3 mL via INTRAVENOUS

## 2021-12-03 MED ORDER — CEFAZOLIN SODIUM-DEXTROSE 2-3 GM-%(50ML) IV SOLR
INTRAVENOUS | Status: DC | PRN
  Administered 2021-12-03: 2 g via INTRAVENOUS

## 2021-12-03 MED ORDER — ONDANSETRON HCL 4 MG PO TABS: 4.0000 mg | ORAL_TABLET | Freq: Four times a day (QID) | ORAL | Status: DC | PRN

## 2021-12-03 MED ORDER — VANCOMYCIN HCL 1000 MG IV SOLR
INTRAVENOUS | Status: DC | PRN
  Administered 2021-12-03: 1000 mg

## 2021-12-03 MED ORDER — FENTANYL CITRATE (PF) 250 MCG/5ML IJ SOLN
INTRAMUSCULAR | Status: DC | PRN
  Administered 2021-12-03: 50 ug via INTRAVENOUS
  Administered 2021-12-03: 25 ug via INTRAVENOUS
  Administered 2021-12-03: 50 ug via INTRAVENOUS
  Administered 2021-12-03: 25 ug via INTRAVENOUS
  Administered 2021-12-03 (×3): 50 ug via INTRAVENOUS
  Administered 2021-12-03 (×2): 25 ug via INTRAVENOUS

## 2021-12-03 MED ORDER — THROMBIN 5000 UNITS EX SOLR
CUTANEOUS | Status: AC
  Filled 2021-12-03: qty 5000

## 2021-12-03 MED ORDER — CEFAZOLIN SODIUM-DEXTROSE 2-4 GM/100ML-% IV SOLN
INTRAVENOUS | Status: AC
  Filled 2021-12-03: qty 100

## 2021-12-03 MED ORDER — CELECOXIB 200 MG PO CAPS
200.0000 mg | ORAL_CAPSULE | Freq: Two times a day (BID) | ORAL | Status: DC
Start: 2021-12-03 — End: 2021-12-04
  Administered 2021-12-03 – 2021-12-04 (×2): 200 mg via ORAL
  Filled 2021-12-03 (×2): qty 1

## 2021-12-03 MED ORDER — SUGAMMADEX SODIUM 200 MG/2ML IV SOLN
INTRAVENOUS | Status: DC | PRN
  Administered 2021-12-03: 200 mg via INTRAVENOUS

## 2021-12-03 MED ORDER — OXYCODONE HCL 5 MG PO TABS: 5.0000 mg | ORAL_TABLET | Freq: Once | ORAL | Status: DC | PRN

## 2021-12-03 MED ORDER — DEXAMETHASONE SODIUM PHOSPHATE 4 MG/ML IJ SOLN: 4.0000 mg | Freq: Four times a day (QID) | INTRAMUSCULAR | Status: DC

## 2021-12-03 MED ORDER — ONDANSETRON HCL 4 MG/2ML IJ SOLN
INTRAMUSCULAR | Status: DC | PRN
  Administered 2021-12-03: 4 mg via INTRAVENOUS

## 2021-12-03 MED ORDER — KETAMINE HCL 50 MG/5ML IJ SOSY
PREFILLED_SYRINGE | INTRAMUSCULAR | Status: AC
  Filled 2021-12-03: qty 5

## 2021-12-03 MED ORDER — FENTANYL CITRATE (PF) 250 MCG/5ML IJ SOLN
INTRAMUSCULAR | Status: AC
  Filled 2021-12-03: qty 5

## 2021-12-03 MED ORDER — ONDANSETRON HCL 4 MG/2ML IJ SOLN
4.0000 mg | Freq: Four times a day (QID) | INTRAMUSCULAR | Status: DC | PRN
Start: 2021-12-03 — End: 2021-12-04

## 2021-12-03 MED ORDER — ORAL CARE MOUTH RINSE: 15.0000 mL | Freq: Once | OROMUCOSAL | Status: AC

## 2021-12-03 MED ORDER — DEXAMETHASONE SODIUM PHOSPHATE 10 MG/ML IJ SOLN
INTRAMUSCULAR | Status: DC | PRN
  Administered 2021-12-03: 10 mg via INTRAVENOUS

## 2021-12-03 MED ORDER — INSULIN ASPART 100 UNIT/ML IJ SOLN
0.0000 [IU] | INTRAMUSCULAR | Status: DC | PRN
  Administered 2021-12-03: 2 [IU] via SUBCUTANEOUS

## 2021-12-03 MED ORDER — POTASSIUM CHLORIDE IN NACL 20-0.9 MEQ/L-% IV SOLN: INTRAVENOUS | Status: DC

## 2021-12-03 MED ORDER — ACETAMINOPHEN 500 MG PO TABS
ORAL_TABLET | ORAL | Status: AC
  Administered 2021-12-03: 1000 mg via ORAL
  Filled 2021-12-03: qty 2

## 2021-12-03 MED ORDER — PHENYLEPHRINE 80 MCG/ML (10ML) SYRINGE FOR IV PUSH (FOR BLOOD PRESSURE SUPPORT)
PREFILLED_SYRINGE | INTRAVENOUS | Status: DC | PRN
  Administered 2021-12-03 (×2): 80 ug via INTRAVENOUS
  Administered 2021-12-03: 160 ug via INTRAVENOUS

## 2021-12-03 MED ORDER — BUPIVACAINE HCL (PF) 0.25 % IJ SOLN
INTRAMUSCULAR | Status: DC | PRN
  Administered 2021-12-03: 10 mL

## 2021-12-03 MED ORDER — PROPOFOL 10 MG/ML IV BOLUS
INTRAVENOUS | Status: AC
  Filled 2021-12-03: qty 20

## 2021-12-03 MED ORDER — LIDOCAINE 2% (20 MG/ML) 5 ML SYRINGE
INTRAMUSCULAR | Status: AC
  Filled 2021-12-03: qty 5

## 2021-12-03 MED ORDER — THROMBIN 20000 UNITS EX SOLR
CUTANEOUS | Status: AC
  Filled 2021-12-03: qty 20000

## 2021-12-03 MED ORDER — ROCURONIUM BROMIDE 10 MG/ML (PF) SYRINGE
PREFILLED_SYRINGE | INTRAVENOUS | Status: DC | PRN
  Administered 2021-12-03 (×2): 50 mg via INTRAVENOUS
  Administered 2021-12-03: 100 mg via INTRAVENOUS

## 2021-12-03 MED ORDER — METHOCARBAMOL 1000 MG/10ML IJ SOLN: 500.0000 mg | Freq: Four times a day (QID) | INTRAVENOUS | Status: DC | PRN

## 2021-12-03 MED ORDER — SODIUM CHLORIDE 0.9 % IV SOLN: 250.0000 mL | INTRAVENOUS | Status: DC

## 2021-12-03 MED ORDER — KETAMINE HCL-SODIUM CHLORIDE 100-0.9 MG/10ML-% IV SOSY
PREFILLED_SYRINGE | INTRAVENOUS | Status: DC | PRN
  Administered 2021-12-03: 30 mg via INTRAVENOUS

## 2021-12-03 MED ORDER — OXYCODONE HCL 5 MG PO TABS
10.0000 mg | ORAL_TABLET | ORAL | Status: DC | PRN
  Administered 2021-12-04 (×3): 10 mg via ORAL
  Filled 2021-12-03 (×3): qty 2

## 2021-12-03 MED ORDER — SODIUM CHLORIDE 0.9% FLUSH
3.0000 mL | INTRAVENOUS | Status: DC | PRN
Start: 2021-12-03 — End: 2021-12-04

## 2021-12-03 MED ORDER — ROCURONIUM BROMIDE 10 MG/ML (PF) SYRINGE
PREFILLED_SYRINGE | INTRAVENOUS | Status: AC
  Filled 2021-12-03: qty 10

## 2021-12-03 MED ORDER — HYDROMORPHONE HCL 1 MG/ML IJ SOLN
0.5000 mg | INTRAMUSCULAR | Status: DC | PRN
  Administered 2021-12-03: 0.5 mg via INTRAVENOUS
  Filled 2021-12-03: qty 0.5

## 2021-12-03 MED ORDER — MENTHOL 3 MG MT LOZG
1.0000 | LOZENGE | OROMUCOSAL | Status: DC | PRN
Start: 2021-12-03 — End: 2021-12-04

## 2021-12-03 MED ORDER — LIDOCAINE 2% (20 MG/ML) 5 ML SYRINGE
INTRAMUSCULAR | Status: DC | PRN
  Administered 2021-12-03: 60 mg via INTRAVENOUS

## 2021-12-03 MED ORDER — CEFAZOLIN SODIUM-DEXTROSE 2-4 GM/100ML-% IV SOLN
2.0000 g | Freq: Three times a day (TID) | INTRAVENOUS | Status: AC
  Administered 2021-12-03 – 2021-12-04 (×2): 2 g via INTRAVENOUS
  Filled 2021-12-03 (×2): qty 100

## 2021-12-03 MED ORDER — BUPIVACAINE HCL (PF) 0.25 % IJ SOLN
INTRAMUSCULAR | Status: AC
  Filled 2021-12-03: qty 30

## 2021-12-03 MED ORDER — ONDANSETRON HCL 4 MG/2ML IJ SOLN
INTRAMUSCULAR | Status: AC
  Filled 2021-12-03: qty 2

## 2021-12-03 MED ORDER — THROMBIN (RECOMBINANT) 20000 UNITS EX SOLR
CUTANEOUS | Status: AC
  Filled 2021-12-03: qty 20000

## 2021-12-03 MED ORDER — ONDANSETRON HCL 4 MG/2ML IJ SOLN: 4.0000 mg | Freq: Once | INTRAMUSCULAR | Status: DC | PRN

## 2021-12-03 MED ORDER — PHENOL 1.4 % MT LIQD
1.0000 | OROMUCOSAL | Status: DC | PRN
Start: 2021-12-03 — End: 2021-12-04

## 2021-12-03 MED ORDER — DEXAMETHASONE SODIUM PHOSPHATE 10 MG/ML IJ SOLN
INTRAMUSCULAR | Status: AC
  Filled 2021-12-03: qty 1

## 2021-12-03 MED ORDER — CHLORHEXIDINE GLUCONATE 0.12 % MT SOLN
15.0000 mL | Freq: Once | OROMUCOSAL | Status: AC
  Administered 2021-12-03: 15 mL via OROMUCOSAL

## 2021-12-03 MED ORDER — MIDAZOLAM HCL 2 MG/2ML IJ SOLN
INTRAMUSCULAR | Status: DC | PRN
  Administered 2021-12-03: 2 mg via INTRAVENOUS

## 2021-12-03 MED ORDER — DEXMEDETOMIDINE (PRECEDEX) IN NS 20 MCG/5ML (4 MCG/ML) IV SYRINGE
PREFILLED_SYRINGE | INTRAVENOUS | Status: DC | PRN
  Administered 2021-12-03: 8 ug via INTRAVENOUS

## 2021-12-03 MED ORDER — METHOCARBAMOL 500 MG PO TABS
500.0000 mg | ORAL_TABLET | Freq: Four times a day (QID) | ORAL | Status: DC | PRN
  Administered 2021-12-04 (×2): 500 mg via ORAL
  Filled 2021-12-03 (×2): qty 1

## 2021-12-03 MED ORDER — VANCOMYCIN HCL 1000 MG IV SOLR
INTRAVENOUS | Status: AC
  Filled 2021-12-03: qty 20

## 2021-12-03 MED ORDER — HYDROXYZINE HCL 25 MG PO TABS
50.0000 mg | ORAL_TABLET | Freq: Once | ORAL | Status: AC
  Administered 2021-12-03: 50 mg via ORAL
  Filled 2021-12-03: qty 2

## 2021-12-03 MED ORDER — MIDAZOLAM HCL 2 MG/2ML IJ SOLN
INTRAMUSCULAR | Status: AC
  Filled 2021-12-03: qty 2

## 2021-12-03 MED ORDER — CHLORHEXIDINE GLUCONATE CLOTH 2 % EX PADS: 6.0000 | MEDICATED_PAD | Freq: Every day | CUTANEOUS | Status: DC

## 2021-12-03 MED ORDER — ALBUTEROL SULFATE HFA 108 (90 BASE) MCG/ACT IN AERS
INHALATION_SPRAY | RESPIRATORY_TRACT | Status: DC | PRN
  Administered 2021-12-03: 6 via RESPIRATORY_TRACT

## 2021-12-03 MED ORDER — THROMBIN 5000 UNITS EX SOLR
OROMUCOSAL | Status: DC | PRN
  Administered 2021-12-03: 5 mL via TOPICAL

## 2021-12-03 MED ORDER — OXYCODONE HCL 5 MG/5ML PO SOLN: 5.0000 mg | Freq: Once | ORAL | Status: DC | PRN

## 2021-12-03 MED ORDER — SENNA 8.6 MG PO TABS
1.0000 | ORAL_TABLET | Freq: Two times a day (BID) | ORAL | Status: DC
Start: 2021-12-03 — End: 2021-12-04
  Administered 2021-12-03 – 2021-12-04 (×2): 8.6 mg via ORAL
  Filled 2021-12-03 (×2): qty 1

## 2021-12-03 MED ORDER — THROMBIN 20000 UNITS EX SOLR
CUTANEOUS | Status: DC | PRN
  Administered 2021-12-03: 20 mL via TOPICAL

## 2021-12-03 MED ORDER — DEXAMETHASONE 4 MG PO TABS
4.0000 mg | ORAL_TABLET | Freq: Four times a day (QID) | ORAL | Status: DC
  Administered 2021-12-03 – 2021-12-04 (×2): 4 mg via ORAL
  Filled 2021-12-03 (×2): qty 1

## 2021-12-03 MED ORDER — LACTATED RINGERS IV SOLN: INTRAVENOUS | Status: DC

## 2021-12-03 MED ORDER — HYDROMORPHONE HCL 1 MG/ML IJ SOLN: 0.2500 mg | INTRAMUSCULAR | Status: DC | PRN

## 2021-12-03 MED ORDER — ACETAMINOPHEN 500 MG PO TABS
1000.0000 mg | ORAL_TABLET | Freq: Four times a day (QID) | ORAL | Status: DC
  Administered 2021-12-03 – 2021-12-04 (×3): 1000 mg via ORAL
  Filled 2021-12-03 (×3): qty 2

## 2021-12-03 MED ORDER — AMISULPRIDE (ANTIEMETIC) 5 MG/2ML IV SOLN: 10.0000 mg | Freq: Once | INTRAVENOUS | Status: DC | PRN

## 2021-12-03 MED ORDER — INSULIN ASPART 100 UNIT/ML IJ SOLN
INTRAMUSCULAR | Status: AC
  Filled 2021-12-03: qty 1

## 2021-12-03 SURGICAL SUPPLY — 69 items
ADH SKN CLS APL DERMABOND .7 (GAUZE/BANDAGES/DRESSINGS) ×1
APL SKNCLS STERI-STRIP NONHPOA (GAUZE/BANDAGES/DRESSINGS) ×1
BAG COUNTER SPONGE SURGICOUNT (BAG) ×2 IMPLANT
BAG SPNG CNTER NS LX DISP (BAG) ×1
BASKET BONE COLLECTION (BASKET) IMPLANT
BENZOIN TINCTURE PRP APPL 2/3 (GAUZE/BANDAGES/DRESSINGS) ×2 IMPLANT
BLADE CLIPPER SURG (BLADE) IMPLANT
BONE CANC CHIPS 40CC CAN1/2 (Bone Implant) ×2 IMPLANT
BUR CARBIDE MATCH 3.0 (BURR) ×2 IMPLANT
CANISTER SUCT 3000ML PPV (MISCELLANEOUS) ×2 IMPLANT
CHIPS CANC BONE 40CC CAN1/2 (Bone Implant) ×1 IMPLANT
CLSR STERI-STRIP ANTIMIC 1/2X4 (GAUZE/BANDAGES/DRESSINGS) ×3 IMPLANT
CNTNR URN SCR LID CUP LEK RST (MISCELLANEOUS) ×1 IMPLANT
CONT SPEC 4OZ STRL OR WHT (MISCELLANEOUS) ×2
COVER BACK TABLE 60X90IN (DRAPES) ×2 IMPLANT
DERMABOND ADVANCED (GAUZE/BANDAGES/DRESSINGS) ×1
DERMABOND ADVANCED .7 DNX12 (GAUZE/BANDAGES/DRESSINGS) IMPLANT
DRAPE C-ARM 42X72 X-RAY (DRAPES) IMPLANT
DRAPE C-ARMOR (DRAPES) ×1 IMPLANT
DRAPE LAPAROTOMY 100X72X124 (DRAPES) ×2 IMPLANT
DRAPE SURG 17X23 STRL (DRAPES) ×2 IMPLANT
DRSG OPSITE POSTOP 4X10 (GAUZE/BANDAGES/DRESSINGS) ×1 IMPLANT
DURAPREP 26ML APPLICATOR (WOUND CARE) ×2 IMPLANT
ELECT REM PT RETURN 9FT ADLT (ELECTROSURGICAL) ×2
ELECTRODE REM PT RTRN 9FT ADLT (ELECTROSURGICAL) ×1 IMPLANT
EVACUATOR 1/8 PVC DRAIN (DRAIN) IMPLANT
GAUZE 4X4 16PLY ~~LOC~~+RFID DBL (SPONGE) IMPLANT
GLOVE BIO SURGEON STRL SZ7 (GLOVE) ×1 IMPLANT
GLOVE BIO SURGEON STRL SZ8 (GLOVE) ×4 IMPLANT
GLOVE BIOGEL PI IND STRL 7.0 (GLOVE) IMPLANT
GLOVE BIOGEL PI IND STRL 7.5 (GLOVE) IMPLANT
GLOVE BIOGEL PI INDICATOR 7.0 (GLOVE) ×1
GLOVE BIOGEL PI INDICATOR 7.5 (GLOVE) ×4
GLOVE ECLIPSE 7.0 STRL STRAW (GLOVE) ×4 IMPLANT
GOWN STRL REUS W/ TWL LRG LVL3 (GOWN DISPOSABLE) IMPLANT
GOWN STRL REUS W/ TWL XL LVL3 (GOWN DISPOSABLE) ×2 IMPLANT
GOWN STRL REUS W/TWL 2XL LVL3 (GOWN DISPOSABLE) IMPLANT
GOWN STRL REUS W/TWL LRG LVL3 (GOWN DISPOSABLE) ×2
GOWN STRL REUS W/TWL XL LVL3 (GOWN DISPOSABLE) ×6
GRAFT BNE CHIP CANC 1-8 40 (Bone Implant) IMPLANT
GRAFT BONE PROTEIOS XL 10CC (Orthopedic Implant) ×1 IMPLANT
HEMOSTAT POWDER KIT SURGIFOAM (HEMOSTASIS) ×1 IMPLANT
KIT BASIN OR (CUSTOM PROCEDURE TRAY) ×2 IMPLANT
KIT TURNOVER KIT B (KITS) ×2 IMPLANT
NDL HYPO 25X1 1.5 SAFETY (NEEDLE) ×1 IMPLANT
NEEDLE HYPO 25X1 1.5 SAFETY (NEEDLE) ×2 IMPLANT
NS IRRIG 1000ML POUR BTL (IV SOLUTION) ×2 IMPLANT
PACK LAMINECTOMY NEURO (CUSTOM PROCEDURE TRAY) ×2 IMPLANT
PAD ARMBOARD 7.5X6 YLW CONV (MISCELLANEOUS) ×9 IMPLANT
ROD TI STRAIGHT 5.5X500 (Rod) ×1 IMPLANT
SCREW CANC PA 5.5X40 (Screw) ×2 IMPLANT
SCREW CANC PA 6.5X45 (Screw) ×2 IMPLANT
SCREW CANC SHANK MOD 6.5X50 (Screw) ×2 IMPLANT
SCREW KODIAK 5.5X50 (Screw) ×4 IMPLANT
SCREW POLYAXIAL TULIP (Screw) ×2 IMPLANT
SET SCREW (Screw) ×20 IMPLANT
SET SCREW SPNE (Screw) IMPLANT
SPONGE SURGIFOAM ABS GEL 100 (HEMOSTASIS) ×2 IMPLANT
SPONGE T-LAP 4X18 ~~LOC~~+RFID (SPONGE) IMPLANT
STRIP CLOSURE SKIN 1/2X4 (GAUZE/BANDAGES/DRESSINGS) ×4 IMPLANT
STRIP MATRIX NEOCORE 20CC (Putty) ×1 IMPLANT
SUT VIC AB 0 CT1 18XCR BRD8 (SUTURE) ×1 IMPLANT
SUT VIC AB 0 CT1 8-18 (SUTURE) ×4
SUT VIC AB 2-0 CP2 18 (SUTURE) ×3 IMPLANT
SUT VIC AB 3-0 SH 8-18 (SUTURE) ×7 IMPLANT
TOWEL GREEN STERILE (TOWEL DISPOSABLE) ×2 IMPLANT
TOWEL GREEN STERILE FF (TOWEL DISPOSABLE) ×2 IMPLANT
TRAY FOLEY MTR SLVR 16FR STAT (SET/KITS/TRAYS/PACK) ×1 IMPLANT
WATER STERILE IRR 1000ML POUR (IV SOLUTION) ×2 IMPLANT

## 2021-12-03 NOTE — Anesthesia Preprocedure Evaluation (Addendum)
Anesthesia Evaluation  Patient identified by MRN, date of birth, ID band Patient awake    Reviewed: Allergy & Precautions, NPO status , Patient's Chart, lab work & pertinent test results, reviewed documented beta blocker date and time   Airway Mallampati: III  TM Distance: >3 FB Neck ROM: Full    Dental  (+) Edentulous Upper, Edentulous Lower   Pulmonary Current Smoker and Patient abstained from smoking.,  1ppd x many years No inhalers    Pulmonary exam normal breath sounds clear to auscultation       Cardiovascular hypertension (126/87 in preop), Pt. on medications and Pt. on home beta blockers Normal cardiovascular exam Rhythm:Regular Rate:Normal     Neuro/Psych negative neurological ROS  negative psych ROS   GI/Hepatic negative GI ROS, Neg liver ROS,   Endo/Other  diabetes, Well Controlled, Type 2, Oral Hypoglycemic AgentsFS 201 in preop  Renal/GU negative Renal ROS  negative genitourinary   Musculoskeletal Lumbar fx   Abdominal   Peds  Hematology negative hematology ROS (+)   Anesthesia Other Findings   Reproductive/Obstetrics negative OB ROS                            Anesthesia Physical Anesthesia Plan  ASA: 3  Anesthesia Plan: General   Post-op Pain Management: Tylenol PO (pre-op)*   Induction: Intravenous  PONV Risk Score and Plan: 1 and Ondansetron, Dexamethasone, Midazolam and Treatment may vary due to age or medical condition  Airway Management Planned: Oral ETT  Additional Equipment: None  Intra-op Plan:   Post-operative Plan: Extubation in OR  Informed Consent: I have reviewed the patients History and Physical, chart, labs and discussed the procedure including the risks, benefits and alternatives for the proposed anesthesia with the patient or authorized representative who has indicated his/her understanding and acceptance.     Dental advisory given  Plan  Discussed with: CRNA  Anesthesia Plan Comments:         Anesthesia Quick Evaluation

## 2021-12-03 NOTE — Transfer of Care (Signed)
Immediate Anesthesia Transfer of Care Note  Patient: Manny Vitolo  Procedure(s) Performed: Open reduction internal fixation lumbar two fracture, instrumented fusion Thoracic Eleven-Lumbar Four  Patient Location: PACU  Anesthesia Type:General  Level of Consciousness: drowsy  Airway & Oxygen Therapy: Patient Spontanous Breathing and Patient connected to face mask oxygen  Post-op Assessment: Report given to RN and Post -op Vital signs reviewed and stable  Post vital signs: Reviewed and stable  Last Vitals:  Vitals Value Taken Time  BP 131/73 12/03/21 1741  Temp    Pulse 102 12/03/21 1743  Resp 19 12/03/21 1743  SpO2 98 % 12/03/21 1743  Vitals shown include unvalidated device data.  Last Pain:  Vitals:   12/03/21 1231  TempSrc:   PainSc: 3          Complications: No notable events documented.

## 2021-12-03 NOTE — Anesthesia Procedure Notes (Signed)
Procedure Name: Intubation Date/Time: 12/03/2021 1:46 PM Performed by: Lance Coon, CRNA Pre-anesthesia Checklist: Patient identified, Emergency Drugs available, Suction available, Patient being monitored and Timeout performed Patient Re-evaluated:Patient Re-evaluated prior to induction Oxygen Delivery Method: Circle system utilized Preoxygenation: Pre-oxygenation with 100% oxygen Induction Type: IV induction Ventilation: Mask ventilation without difficulty Laryngoscope Size: Miller and 3 Grade View: Grade I Tube type: Oral Tube size: 7.5 mm Number of attempts: 1 Airway Equipment and Method: Stylet Placement Confirmation: ETT inserted through vocal cords under direct vision, positive ETCO2 and breath sounds checked- equal and bilateral Secured at: 21 cm Tube secured with: Tape Dental Injury: Teeth and Oropharynx as per pre-operative assessment

## 2021-12-03 NOTE — Progress Notes (Signed)
Subjective: Patient reports some improvement in his leg symptoms.  He is sitting up in bed with his TLSO in place  Objective: Vital signs in last 24 hours: Temp:  [98 F (36.7 C)-98.9 F (37.2 C)] 98 F (36.7 C) (05/31 0810) Pulse Rate:  [66-125] 87 (05/31 0810) Resp:  [18-19] 18 (05/31 0810) BP: (119-145)/(71-90) 119/71 (05/31 0810) SpO2:  [95 %-99 %] 95 % (05/31 0810) Weight:  [70.3 kg] 70.3 kg (05/30 1628)  Intake/Output from previous day: 05/30 0701 - 05/31 0700 In: -  Out: 850 [Urine:850] Intake/Output this shift: No intake/output data recorded.  Neurologic: Grossly normal except for 2 out of 5 bilateral hip flexion and knee extensors, dorsiflexion is 5 out of 5 today.  He seems to move his legs better in bed when talking to me then during the actual physical exam when asking to move them to command.  Sensation appears to be grossly intact.  Lab Results: Lab Results  Component Value Date   WBC 14.5 (H) 11/26/2021   HGB 15.6 11/26/2021   HCT 44.1 11/26/2021   MCV 91.1 11/26/2021   PLT 253 11/26/2021   Lab Results  Component Value Date   INR 1.1 12/02/2021   BMET Lab Results  Component Value Date   NA 136 11/26/2021   K 4.2 11/26/2021   CL 102 11/26/2021   CO2 24 11/26/2021   GLUCOSE 275 (H) 11/26/2021   BUN 19 11/26/2021   CREATININE 0.92 11/26/2021   CALCIUM 9.4 11/26/2021    Studies/Results: MR THORACIC SPINE WO CONTRAST  Result Date: 12/03/2021 CLINICAL DATA:  Leg weakness, lumbar spine fracture EXAM: MRI THORACIC SPINE WITHOUT CONTRAST TECHNIQUE: Multiplanar, multisequence MR imaging of the thoracic spine was performed. No intravenous contrast was administered. COMPARISON:  None Available. FINDINGS: Alignment:  No significant listhesis. Vertebrae: T12 fracture as identified on prior imaging. Fracture line underlies the superior endplate with STIR hyperintensity but no substantial surrounding edema. Height loss is minor. Chronic mild T4 compression fracture  with anterior wedging. There is also mild loss of height at additional levels with superimposed degenerative endplate irregularity and mild degenerative endplate marrow edema. Cord:  No abnormal signal. Paraspinal and other soft tissues: Unremarkable. Disc levels: Minor degenerative disc disease. For example trace disc bulges at T3-T4 and T4-T5. There is mild facet arthropathy. No canal or foraminal stenosis at any level. IMPRESSION: Likely subacute T12 fracture as identified previously. No additional acute or subacute thoracic spine fracture. Electronically Signed   By: Macy Mis M.D.   On: 12/03/2021 09:16   MR LUMBAR SPINE WO CONTRAST  Result Date: 12/02/2021 CLINICAL DATA:  L2 compression fracture and like weakness. EXAM: MRI LUMBAR SPINE WITHOUT CONTRAST TECHNIQUE: Multiplanar, multisequence MR imaging of the lumbar spine was performed. No intravenous contrast was administered. COMPARISON:  11/24/2021 CT scan FINDINGS: Segmentation: The lowest lumbar type non-rib-bearing vertebra is labeled as L5. Alignment:  No vertebral subluxation is observed. Vertebrae: As noted on prior CT, there is an L2 burst fracture with 9 mm posterior bony retropulsion as shown on image 10 series 2, and a proximally 60% loss of vertebral body height. There is diffuse edema signal in the L2 vertebral body. In addition, there is a subtle superior endplate compression fracture at L4 with 10% loss of vertebral body heights, and marrow edema along the superior endplate compression. The fracture extends to the middle column/posterior vertebral body margin although there is no posterior bony retropulsion. In addition, there is a transverse fracture through the upper portion  of the T12 vertebral body. This likewise extends to the posterior vertebral margin and accordingly involves the anterior and middle columns. There is about 10% loss of vertebral body height at T12. Conus medullaris and cauda equina: Conus extends to the L2 level.  Conus and cauda equina appear normal. Paraspinal and other soft tissues: The compression fractures in the lower thoracic and lumbar spine orally associated with subtle paraspinal edema. Disc levels: T11-12: No impingement. This level is only included on the parasagittal images. T12-L1: Unremarkable L1-2: Borderline left subarticular lateral recess stenosis on image 15 series 5. At the level of maximum posterior bony retropulsion, the AP diameter of the thecal sac is 1.1 cm. L2-3: Unremarkable L3-4: Unremarkable L4-5: No impingement. Mild disc bulge. Small left foraminal annular tear. L5-S1: No impingement.  Minimal disc bulge. IMPRESSION: 1. Subacute L2 burst fracture with 9 mm posterior bony retropulsion and about 60% loss of vertebral body height. The posterior bony retropulsion is associated with equivocal left lateral recess stenosis just below the L1-2 level. 2. Subacute superior endplate compression fracture at L4 involving the anterior and middle columns, without substantial bony retropulsion. 10% loss of vertebral height. 3. Subacute transverse upper vertebral fracture at T12 involvement of the anterior and middle columns, no retropulsion. 10% loss of vertebral height. Electronically Signed   By: Van Clines M.D.   On: 12/02/2021 21:25    Assessment/Plan: L2 burst fracture, leg weakness seems somewhat better this morning.  He is ready to move forward with open reduction internal fixation of L2 burst fracture with pedicle screw fixation T11-L4.  This will also encompass the minor compression fractures at T12 and L4 seen on the MRI.  Thoracic MRI is okay with no cord compression.  He understands the risk of the surgery include but are not limited to bleeding, infection, nerve injury, spinal cord injury, CSF leak, leg weakness, numbness, loss of bowel bladder or sexual function, pseudoarthrosis, adjacent level disease, misplaced instrumentation, instrumentation failure, broken instrumentation, for  further surgery, lack of relief of symptoms, worsening symptoms, vascular injury, and anesthesia risk including DVT pneumonia MI and death.  He agrees to proceed  Estimated body mass index is 23.57 kg/m as calculated from the following:   Height as of an earlier encounter on 12/02/21: '5\' 8"'$  (1.727 m).   Weight as of an earlier encounter on 12/02/21: 70.3 kg.    LOS: 1 day    Cody Berger 12/03/2021, 9:40 AM

## 2021-12-03 NOTE — Op Note (Signed)
12/03/2021  5:27 PM  PATIENT:  Cody Berger  56 y.o. male  PRE-OPERATIVE DIAGNOSIS: Unstable L2 burst fracture, T12 fracture, L4 superior endplate fracture, back pain with leg pain and leg weakness  POST-OPERATIVE DIAGNOSIS:  same  PROCEDURE:   1.  Open reduction internal fixation of L2 burst fracture with left L2 Hemi facetectomy and foraminotomy with reduction of the fracture fragments and sublaminar decompression 2.  Segmental posterior fixation T11-L4 using Alphatec pedicle screws.  4. Intertransverse arthrodesis T11-L4 using morcellized autograft and allograft.  SURGEON:  Sherley Bounds, MD  ASSISTANTS: Glenford Peers FNP  ANESTHESIA:  General  EBL: 75 ml  Total I/O In: 2877.3 [I.V.:2877.3] Out: 815 [Urine:640; Blood:175]  BLOOD ADMINISTERED: None  DRAINS: none   INDICATION FOR PROCEDURE: This patient presented with severe back pain with leg weakness. Imaging revealed L2 burst fracture with retropulsion of fracture fragments, and superior endplate fractures of S85 and L4.  He presented with weakness in the proximal legs and difficulty with ambulation.. The patient tried a reasonable attempt at conservative medical measures without relief. I recommended decompression and instrumented fusion to address the stenosis as well as the segmental  instability.  Patient understood the risks, benefits, and alternatives and potential outcomes and wished to proceed.  PROCEDURE DETAILS:  The patient was brought to the operating room. After induction of generalized endotracheal anesthesia the patient was rolled into the prone position on chest rolls and all pressure points were padded. The patient's thoracic and lumbar region was cleaned with Betadine scrub and then prepped with DuraPrep and draped in the usual sterile fashion.  Local anesthesia was injected and then a dorsal midline incision was made and carried down to the thoracic and lumbar fascia. The fascia was opened and the  paraspinous musculature was taken down in a subperiosteal fashion to expose T11-L4. A self-retaining retractor was placed. Intraoperative fluoroscopy confirmed my level, and I started with placement of the L1 cortical pedicle screws. The pedicle screw entry zones were identified utilizing surface landmarks and  AP and lateral fluoroscopy. I scored the cortex with the high-speed drill and then used the hand drill to drill an upward and outward direction into the pedicle. I then tapped line to line. I then placed a 5.5 by 40 mm cortical pedicle screw into the pedicles of L1 bilaterally.  I then located the pedicle screw entry zones of T11 and T12 utilizing AP and lateral fluoroscopy.  I probed each pedicle with the thoracic pedicle probe, tapped with a 4.5 tap and then placed 5.5 x 50 mm pedicle screws into the pedicles of T11 and T12 bilaterally.   I then turned my attention to the decompression at L2 and a complete lumbar laminectomy, hemi- facetectomy, and foraminotomy was performed at L2 on the left.  My nurse practitioner was directly involved in the decompression and exposure of the neural elements.  I drilled up under the spinous process and drilled the undersurface of the opposite lamina to perform a sublaminar decompression.  the yellow ligament was removed to expose the underlying dura and nerve roots, and generous foraminotomies were performed to adequately decompress the neural elements. Both the exiting and traversing nerve roots were decompressed until a coronary dilator passed easily along the nerve roots.  Then used a Siefert tamp and lateral fluoroscopy to try to tamp the retropulsed fracture fragments back down at the pedicle level of L2.  The canal appeared to be well decompressed.  I could see the neural elements pulsating through the  thecal sac.  We then turned our attention to the placement of the lower pedicle screws at L3 and L4. The pedicle screw entry zones were identified utilizing  surface landmarks and fluoroscopy. I drilled into each pedicle utilizing the hand drill, and tapped each pedicle with the appropriate tap. We palpated with a ball probe to assure no break in the cortex. We then placed 6.5 x 45 mm pedicle screws into the pedicles of L4 bilaterally and 6.5 x 50 mm pedicle screws at L3 bilaterally.  My nurse practitioner assisted in placement of the pedicle screws.  We then decorticated the transverse processes and posterior elements and laid a mixture of morcellized autograft and allograft out over these to perform intertransverse arthrodesis at T11-L4. We then placed gently kyphotic rods into the multiaxial screw heads of the pedicle screws and locked these in position with the locking caps and anti-torque device. We then checked our construct with AP and lateral fluoroscopy. Irrigated with copious amounts of saline solution. Inspected the dura once again to assure adequate decompression, lined to the dura with Gelfoam, placed powdered vancomycin into the wound, and then we closed the muscle and the fascia with 0 Vicryl. Closed the subcutaneous tissues with 2-0 Vicryl and subcuticular tissues with 3-0 Vicryl. The skin was closed with benzoin and Steri-Strips. Dressing was then applied, the patient was awakened from general anesthesia and transported to the recovery room in stable condition. At the end of the procedure all sponge, needle and instrument counts were correct.   PLAN OF CARE: admit to inpatient  PATIENT DISPOSITION:  PACU - hemodynamically stable.   Delay start of Pharmacological VTE agent (>24hrs) due to surgical blood loss or risk of bleeding:  yes

## 2021-12-03 NOTE — Progress Notes (Signed)
Pt noticed to have a play station and TV, including extension cord with Controllers in the room, pt advised that these equipments are not allowed per [facility] Policy due to hazard prevention, Facilities dept called to clarify, they also came to the room and explained the reason to the pt as well as the [place] who emphasized the Policy. Pt got upset and requested to leave AMA that he cannot stay without his entertainment. Pt's wife came up and suggested to speak with pt, will however continue to monitor. [place]-Asidi, Dracen Reigle Efe

## 2021-12-04 ENCOUNTER — Encounter (HOSPITAL_COMMUNITY): Payer: Self-pay | Admitting: Neurological Surgery

## 2021-12-04 LAB — GLUCOSE, CAPILLARY: Glucose-Capillary: 308 mg/dL — ABNORMAL HIGH (ref 70–99)

## 2021-12-04 MED ORDER — METOPROLOL TARTRATE 12.5 MG HALF TABLET
12.5000 mg | ORAL_TABLET | Freq: Two times a day (BID) | ORAL | Status: DC
  Administered 2021-12-04: 12.5 mg via ORAL
  Filled 2021-12-04: qty 1

## 2021-12-04 MED ORDER — INSULIN ASPART 100 UNIT/ML IJ SOLN
0.0000 [IU] | Freq: Three times a day (TID) | INTRAMUSCULAR | Status: DC
  Administered 2021-12-04: 11 [IU] via SUBCUTANEOUS

## 2021-12-04 MED ORDER — DONEPEZIL HCL 10 MG PO TABS: 10.0000 mg | ORAL_TABLET | Freq: Every day | ORAL | Status: DC

## 2021-12-04 MED ORDER — METHOCARBAMOL 500 MG PO TABS: 500.0000 mg | ORAL_TABLET | Freq: Four times a day (QID) | ORAL | 0 refills | Status: DC | PRN

## 2021-12-04 MED ORDER — DIVALPROEX SODIUM ER 500 MG PO TB24: 1000.0000 mg | ORAL_TABLET | ORAL | Status: DC

## 2021-12-04 MED ORDER — LURASIDONE HCL 40 MG PO TABS
40.0000 mg | ORAL_TABLET | Freq: Every evening | ORAL | Status: DC
  Filled 2021-12-04: qty 1

## 2021-12-04 MED ORDER — OXYCODONE-ACETAMINOPHEN 5-325 MG PO TABS: 1.0000 | ORAL_TABLET | ORAL | 0 refills | Status: AC | PRN

## 2021-12-04 MED ORDER — OYSTER SHELL CALCIUM/D3 500-5 MG-MCG PO TABS
1.0000 | ORAL_TABLET | Freq: Every day | ORAL | Status: DC
  Administered 2021-12-04: 1 via ORAL
  Filled 2021-12-04: qty 1

## 2021-12-04 MED ORDER — LISINOPRIL 2.5 MG PO TABS
2.5000 mg | ORAL_TABLET | Freq: Every day | ORAL | Status: DC
  Administered 2021-12-04: 2.5 mg via ORAL
  Filled 2021-12-04: qty 1

## 2021-12-04 NOTE — Evaluation (Signed)
Occupational Therapy Evaluation and Discharge Patient Details Name: Cody Berger MRN: [id number] DOB: 11-24-1965 Today's Date: 12/04/2021   History of Present Illness 56 y.o. male admitted for L2 fracture with leg weakness 12/02/21. Recent ED visit on 5/23 for fall after seizure sustaining L2 burst fx. S/p ORIF L2 fracture, instrumented fusion T11-L4 on 5/31. PMH: DM, PTSD   Clinical Impression   PTA, patient was living with his wife and reports independent for ADLs. Currently patient able to complete ADLs overall at Mod I with the exception of tub transfer, requiring Supervision.  Provided education on compensatory techniques.  Patient demonstrates carryover of back precautions and compensatory techniques.  No further Acute care OT services will be provided at this time.  Recommend d/c home with intermittent assist from wife once medically stable per physician.     Recommendations for follow up therapy are one component of a multi-disciplinary discharge planning process, led by the attending physician.  Recommendations may be updated based on patient status, additional functional criteria and insurance authorization.   Follow Up Recommendations  No OT follow up    Assistance Recommended at Discharge PRN  Patient can return home with the following A little help with walking and/or transfers;A little help with bathing/dressing/bathroom;Assistance with cooking/housework;Assist for transportation    Functional Status Assessment  Patient has had a recent decline in their functional status and demonstrates the ability to make significant improvements in function in a reasonable and predictable amount of time.  Equipment Recommendations  None recommended by OT    Recommendations for Other Services       Precautions / Restrictions Precautions Precautions: Back Precaution Booklet Issued: Yes (comment) Precaution Comments: educated on back precautions and compensatory techniques for  ADLs Required Braces or Orthoses: Spinal Brace Spinal Brace: Thoracolumbosacral orthotic;Applied in sitting position Restrictions Weight Bearing Restrictions: No      Mobility Bed Mobility               General bed mobility comments: provided education on log roll technique.  Patient verbalized understanding.    Transfers Overall transfer level: Modified independent Equipment used: Rolling walker (2 wheels), Ambulation equipment used                      Balance Overall balance assessment: Modified Independent                                         ADL either performed or assessed with clinical judgement   ADL Overall ADL's : Modified independent                                       General ADL Comments: Patient educated on back precautions, grooming, LB ADLS, brace management, tub transfer.  He demonstrated Mod I and compensatory techniques for lower body ADLs (figure 4 bilateral LEs). Patient performing tub transfer at Supervision.     Vision Baseline Vision/History: 1 Wears glasses       Perception     Praxis      Pertinent Vitals/Pain Pain Assessment Pain Assessment: Faces Faces Pain Scale: No hurt Pain Intervention(s): Monitored during session     Hand Dominance     Extremity/Trunk Assessment Upper Extremity Assessment Upper Extremity Assessment: Overall WFL for tasks assessed   Lower Extremity Assessment Lower Extremity  Assessment: Defer to PT evaluation   Cervical / Trunk Assessment Cervical / Trunk Assessment: Back Surgery   Communication Communication Communication: No difficulties (very talkative)   Cognition Arousal/Alertness: Awake/alert Behavior During Therapy: WFL for tasks assessed/performed Overall Cognitive Status: Within Functional Limits for tasks assessed                                 General Comments: tangiental and talkative.  Feel this is baseline     General  Comments  VSS, wife present for session    Exercises     Shoulder Instructions      Home Living Family/patient expects to be discharged to:: Private residence Living Arrangements: Spouse/significant other Available Help at Discharge: Available PRN/intermittently;Family Type of Home: Apartment Home Access: Level entry     Home Layout: One level     Bathroom Shower/Tub: Teacher, early years/pre: Standard Bathroom Accessibility: Yes   Home Equipment: Cane - single point;Shower seat          Prior Functioning/Environment Prior Level of Function : Independent/Modified Independent             Mobility Comments: uses cane in home when hips are bothering him, and always in the community ADLs Comments: patient reports independent        OT Problem List: Decreased strength;Decreased activity tolerance;Decreased knowledge of use of DME or AE;Decreased knowledge of precautions;Pain      OT Treatment/Interventions:      OT Goals(Current goals can be found in the care plan section) Acute Rehab OT Goals Patient Stated Goal: "Go home today as soon as possible." OT Goal Formulation: All assessment and education complete, DC therapy  OT Frequency:      Co-evaluation              AM-PAC OT "6 Clicks" Daily Activity     Outcome Measure Help from another person eating meals?: None Help from another person taking care of personal grooming?: None Help from another person toileting, which includes using toliet, bedpan, or urinal?: A Little Help from another person bathing (including washing, rinsing, drying)?: A Little Help from another person to put on and taking off regular upper body clothing?: A Little Help from another person to put on and taking off regular lower body clothing?: None 6 Click Score: 21   End of Session Equipment Utilized During Treatment: Rolling walker (2 wheels) Nurse Communication: Mobility status;Precautions (patient requesting to sit  in straight back chair)  Activity Tolerance: Patient tolerated treatment well Patient left: in chair;with call bell/phone within reach  OT Visit Diagnosis: Muscle weakness (generalized) (M62.81)                Time: 6789-3810 OT Time Calculation (min): 30 min Charges:  OT General Charges $OT Visit: 1 Visit OT Evaluation $OT Eval Low Complexity: 1 Low OT Treatments $Self Care/Home Management : 8-22 mins  Herschell Dimes, OTS 12/04/2021, 2:13 PM

## 2021-12-04 NOTE — TOC Initial Note (Signed)
Transition of Care [facility]) - Initial/Assessment Note    Patient Details  Name: Cody Berger MRN: [id number] Date of Birth: September 07, 1965  Transition of Care [facility]) CM/SW Contact:    Ninfa Meeker, RN Phone Number: 12/04/2021, 1:41 PM  Clinical Narrative:  56 yr old gentleman s/p ORIF L2 with fusion, Thoracic 11- C4. Per therapist  patient has no HHPT needs. DME hs been requested and will be delivered to patient's room. TOC Team will continue to follow.            Expected Discharge Plan: Home/Self Care Barriers to Discharge: Continued Medical Work up   Patient Goals and CMS Choice        Expected Discharge Plan and Services Expected Discharge Plan: Home/Self Care     Post Acute Care Choice: Durable Medical Equipment                     DME Agency: [employer] Date DME Agency Contacted: 12/04/21 Time DME Agency Contacted: 1779 Representative spoke with at DME Agency: [place]: NA [place] Agency: NA        Prior Living Arrangements/Services     Patient language and need for interpreter reviewed:: Yes        Need for Family Participation in Patient Care: Yes (Comment) Care giver support system in place?: Yes (comment)   Criminal Activity/Legal Involvement Pertinent to Current Situation/Hospitalization: No - Comment as needed  Activities of Daily Living Home Assistive Devices/Equipment: Cane (specify quad or straight) ADL Screening (condition at time of admission) Patient's cognitive ability adequate to safely complete daily activities?: Yes Is the patient deaf or have difficulty hearing?: No Does the patient have difficulty seeing, even when wearing glasses/contacts?: No Does the patient have difficulty concentrating, remembering, or making decisions?: No Patient able to express need for assistance with ADLs?: Yes Does the patient have difficulty dressing or bathing?: No Independently performs ADLs?: No Communication: Independent Does the patient have  difficulty walking or climbing stairs?: Yes Weakness of Legs: Both Weakness of Arms/Hands: None  Permission Sought/Granted                  Emotional Assessment              Admission diagnosis:  Lumbar compression fracture ([place]) [S32.000A] S/P lumbar fusion [Z98.1] Patient Active Problem List   Diagnosis Date Noted   S/P lumbar fusion 12/03/2021   Lumbar compression fracture ([place]) 12/02/2021   PCP:  Clinic, [place]:   [facility] [place] [place] [address] Phone: [phone] Fax: [place], [place] - [address] [place] [address] Phone: [phone] Fax: [phone]     Social Determinants of Health (SDOH) Interventions    Readmission Risk Interventions     View : No data to display.

## 2021-12-04 NOTE — Discharge Summary (Signed)
Physician Discharge Summary  Patient ID: Cody Berger MRN: [id number] DOB/AGE: March 06, 1966 56 y.o.  Admit date: 12/02/2021 Discharge date: 12/04/2021  Admission Diagnoses:  Unstable L2 burst fracture, T12 fracture, L4 superior endplate fracture, back pain with leg pain and leg weakness   Discharge Diagnoses: same   Discharged Condition: good  Hospital Course: The patient was admitted on 12/02/2021 and taken to the operating room where the patient underwent lumbar fusion T11-L4. The patient tolerated the procedure well and was taken to the recovery room and then to the floor in stable condition. The hospital course was routine. There were no complications. The wound remained clean dry and intact. Pt had appropriate back soreness. No complaints of leg pain or new N/T/W. The patient remained afebrile with stable vital signs, and tolerated a regular diet. The patient continued to increase activities, and pain was well controlled with oral pain medications.   Consults: None  Significant Diagnostic Studies:  Results for orders placed or performed during the hospital encounter of 12/02/21  Surgical pcr screen   Specimen: Nasal Mucosa; Nasal Swab  Result Value Ref Range   MRSA, PCR NEGATIVE NEGATIVE   Staphylococcus aureus POSITIVE (A) NEGATIVE  Hemoglobin A1c  Result Value Ref Range   Hgb A1c MFr Bld 7.8 (H) 4.8 - 5.6 %   Mean Plasma Glucose 177.16 mg/dL  Protime-INR  Result Value Ref Range   Prothrombin Time 14.2 11.4 - 15.2 seconds   INR 1.1 0.8 - 1.2  Glucose, capillary  Result Value Ref Range   Glucose-Capillary 186 (H) 70 - 99 mg/dL  Glucose, capillary  Result Value Ref Range   Glucose-Capillary 250 (H) 70 - 99 mg/dL  Glucose, capillary  Result Value Ref Range   Glucose-Capillary 201 (H) 70 - 99 mg/dL  CBC per protocol  Result Value Ref Range   WBC 9.2 4.0 - 10.5 K/uL   RBC 4.53 4.22 - 5.81 MIL/uL   Hemoglobin 14.8 13.0 - 17.0 g/dL   HCT 42.4 39.0 - 52.0 %   MCV 93.6 80.0  - 100.0 fL   MCH 32.7 26.0 - 34.0 pg   MCHC 34.9 30.0 - 36.0 g/dL   RDW 12.0 11.5 - 15.5 %   Platelets 322 150 - 400 K/uL   nRBC 0.0 0.0 - 0.2 %  Glucose, capillary  Result Value Ref Range   Glucose-Capillary 169 (H) 70 - 99 mg/dL   Comment 1 Notify RN    Comment 2 Document in Chart   Glucose, capillary  Result Value Ref Range   Glucose-Capillary 201 (H) 70 - 99 mg/dL  Glucose, capillary  Result Value Ref Range   Glucose-Capillary 218 (H) 70 - 99 mg/dL  Glucose, capillary  Result Value Ref Range   Glucose-Capillary 308 (H) 70 - 99 mg/dL   Comment 1 Notify RN    Comment 2 Document in Chart   Type and screen [place]  Result Value Ref Range   ABO/RH(D) AB POS    Antibody Screen NEG    Sample Expiration      12/06/2021,2359 Performed at [facility], [address], [place], [place] [address]   ABO/Rh  Result Value Ref Range   ABO/RH(D)      AB POS Performed at [facility] [address] [place], [place] [address]     DG THORACOLUMABAR SPINE  Result Date: 12/03/2021 CLINICAL DATA:  ORIF L2 fracture EXAM: THORACOLUMBAR SPINE 1V COMPARISON:  Lumbar spine radiographs 11/24/2021. Lumbar MRI  12/02/2021 FINDINGS: PA and lateral images lumbar spine were obtained. Moderate compression fracture of L2 noted. Pedicle screw and posterior rod fusion extending from T11 through L4. Hardware in satisfactory position without complication. IMPRESSION: Pedicle screw and rod lumbar fusion for compression fracture of L2. Electronically Signed   By: Franchot Gallo M.D.   On: 12/03/2021 17:56   DG Lumbar Spine Complete  Result Date: 11/24/2021 CLINICAL DATA:  Seizure, severe low back pain. EXAM: LUMBAR SPINE - COMPLETE 4+ VIEW COMPARISON:  None Available. FINDINGS: There is an acute compression deformity with a vertical elements at L2 with loss of vertebral body height of approximately 60% a slightly retropulsed element is noted. Mild retrolisthesis at L4-L5.  Intervertebral disc space is maintained in the lumbar spine. There is mild intervertebral disc space narrowing with degenerative endplate changes in the thoracic spine. Atherosclerotic calcification of the aorta is noted. IMPRESSION: Acute compression fracture at L2 with loss of vertebral body height of approximately 60% and slightly retropulsed element. Electronically Signed   By: Brett Fairy M.D.   On: 11/24/2021 22:56   CT HEAD WO CONTRAST (5MM)  Result Date: 11/24/2021 CLINICAL DATA:  New onset seizure EXAM: CT HEAD WITHOUT CONTRAST TECHNIQUE: Contiguous axial images were obtained from the base of the skull through the vertex without intravenous contrast. RADIATION DOSE REDUCTION: This exam was performed according to the departmental dose-optimization program which includes automated exposure control, adjustment of the mA and/or kV according to patient size and/or use of iterative reconstruction technique. COMPARISON:  None Available. FINDINGS: Brain: No acute infarct or hemorrhage. Lateral ventricles and midline structures are unremarkable. No acute extra-axial fluid collections. No mass effect. Vascular: No hyperdense vessel or unexpected calcification. Skull: Normal. Negative for fracture or focal lesion. Sinuses/Orbits: No acute finding. Other: None. IMPRESSION: 1. No acute intracranial process. Electronically Signed   By: Randa Ngo M.D.   On: 11/24/2021 23:04   CT Lumbar Spine Wo Contrast  Result Date: 11/25/2021 CLINICAL DATA:  Seizure, severe low back pain, L2 compression fracture on x-ray. EXAM: CT LUMBAR SPINE WITHOUT CONTRAST TECHNIQUE: Multidetector CT imaging of the lumbar spine was performed without intravenous contrast administration. Multiplanar CT image reconstructions were also generated. RADIATION DOSE REDUCTION: This exam was performed according to the departmental dose-optimization program which includes automated exposure control, adjustment of the mA and/or kV according to  patient size and/or use of iterative reconstruction technique. COMPARISON:  No prior CT, correlation is made with lumbar spine radiographs 11/24/2021 FINDINGS: Segmentation: 5 lumbar type vertebrae. Alignment: No listhesis.  Mild levocurvature. Vertebrae: Acute burst fracture of the L2 vertebral body, which extends through the anterior, posterior, superior, and inferior cortex but does not appear to involve the posterior elements. There is approximately 65% vertebral body height loss, some of which may have been present prior to this acute fracture. 8 mm retropulsion of the posterosuperior cortex, which causes approximately 40% canal narrowing. No other acute fracture or suspicious osseous lesion. Paraspinal and other soft tissues: Knee aortic atherosclerosis. No acute finding. Disc levels: T12-L1: No significant disc bulge. No spinal canal stenosis or neural foraminal narrowing. L1-L2: Minimal disc bulge. No spinal canal stenosis at the level of the disc space. 8 mm retropulsion of the posterosuperior cortex of L2, which causes at least mild spinal canal stenosis and narrows the lateral recesses. No neural foraminal narrowing. L2-L3: Minimal disc bulge. No spinal canal stenosis or neural foraminal narrowing. L3-L4: No significant disc bulge. No spinal canal stenosis or neural foraminal narrowing. L4-L5: Minimal  disc bulge. No spinal canal stenosis or neural foraminal narrowing. L5-S1: No significant disc bulge. No spinal canal stenosis or neural foraminal narrowing. IMPRESSION: 1. Acute burst fracture of L2, which does not appear to involve the posterior elements, with 8 mm retropulsion of the posterosuperior cortex, resulting in 40% spinal canal narrowing just below the L1-L2 level. This may have been superimposed on a more chronic compression deformity. 2. No other spinal canal stenosis or neural foraminal narrowing. Electronically Signed   By: Merilyn Baba M.D.   On: 11/25/2021 00:19   MR THORACIC SPINE WO  CONTRAST  Result Date: 12/03/2021 CLINICAL DATA:  Leg weakness, lumbar spine fracture EXAM: MRI THORACIC SPINE WITHOUT CONTRAST TECHNIQUE: Multiplanar, multisequence MR imaging of the thoracic spine was performed. No intravenous contrast was administered. COMPARISON:  None Available. FINDINGS: Alignment:  No significant listhesis. Vertebrae: T12 fracture as identified on prior imaging. Fracture line underlies the superior endplate with STIR hyperintensity but no substantial surrounding edema. Height loss is minor. Chronic mild T4 compression fracture with anterior wedging. There is also mild loss of height at additional levels with superimposed degenerative endplate irregularity and mild degenerative endplate marrow edema. Cord:  No abnormal signal. Paraspinal and other soft tissues: Unremarkable. Disc levels: Minor degenerative disc disease. For example trace disc bulges at T3-T4 and T4-T5. There is mild facet arthropathy. No canal or foraminal stenosis at any level. IMPRESSION: Likely subacute T12 fracture as identified previously. No additional acute or subacute thoracic spine fracture. Electronically Signed   By: Macy Mis M.D.   On: 12/03/2021 09:16   MR LUMBAR SPINE WO CONTRAST  Result Date: 12/02/2021 CLINICAL DATA:  L2 compression fracture and like weakness. EXAM: MRI LUMBAR SPINE WITHOUT CONTRAST TECHNIQUE: Multiplanar, multisequence MR imaging of the lumbar spine was performed. No intravenous contrast was administered. COMPARISON:  11/24/2021 CT scan FINDINGS: Segmentation: The lowest lumbar type non-rib-bearing vertebra is labeled as L5. Alignment:  No vertebral subluxation is observed. Vertebrae: As noted on prior CT, there is an L2 burst fracture with 9 mm posterior bony retropulsion as shown on image 10 series 2, and a proximally 60% loss of vertebral body height. There is diffuse edema signal in the L2 vertebral body. In addition, there is a subtle superior endplate compression fracture at  L4 with 10% loss of vertebral body heights, and marrow edema along the superior endplate compression. The fracture extends to the middle column/posterior vertebral body margin although there is no posterior bony retropulsion. In addition, there is a transverse fracture through the upper portion of the T12 vertebral body. This likewise extends to the posterior vertebral margin and accordingly involves the anterior and middle columns. There is about 10% loss of vertebral body height at T12. Conus medullaris and cauda equina: Conus extends to the L2 level. Conus and cauda equina appear normal. Paraspinal and other soft tissues: The compression fractures in the lower thoracic and lumbar spine orally associated with subtle paraspinal edema. Disc levels: T11-12: No impingement. This level is only included on the parasagittal images. T12-L1: Unremarkable L1-2: Borderline left subarticular lateral recess stenosis on image 15 series 5. At the level of maximum posterior bony retropulsion, the AP diameter of the thecal sac is 1.1 cm. L2-3: Unremarkable L3-4: Unremarkable L4-5: No impingement. Mild disc bulge. Small left foraminal annular tear. L5-S1: No impingement.  Minimal disc bulge. IMPRESSION: 1. Subacute L2 burst fracture with 9 mm posterior bony retropulsion and about 60% loss of vertebral body height. The posterior bony retropulsion is associated with  equivocal left lateral recess stenosis just below the L1-2 level. 2. Subacute superior endplate compression fracture at L4 involving the anterior and middle columns, without substantial bony retropulsion. 10% loss of vertebral height. 3. Subacute transverse upper vertebral fracture at T12 involvement of the anterior and middle columns, no retropulsion. 10% loss of vertebral height. Electronically Signed   By: Van Clines M.D.   On: 12/02/2021 21:25   DG C-Arm 1-60 Min-No Report  Result Date: 12/03/2021 Fluoroscopy was utilized by the requesting physician.  No  radiographic interpretation.   DG C-Arm 1-60 Min-No Report  Result Date: 12/03/2021 Fluoroscopy was utilized by the requesting physician.  No radiographic interpretation.   DG C-Arm 1-60 Min-No Report  Result Date: 12/03/2021 Fluoroscopy was utilized by the requesting physician.  No radiographic interpretation.    Antibiotics:  Anti-infectives (From admission, onward)    Start     Dose/Rate Route Frequency Ordered Stop   12/03/21 2100  ceFAZolin (ANCEF) IVPB 2g/100 mL premix        2 g 200 mL/hr over 30 Minutes Intravenous Every 8 hours 12/03/21 2013 12/04/21 0656   12/03/21 1658  vancomycin (VANCOCIN) powder  Status:  Discontinued          As needed 12/03/21 1659 12/03/21 1736   12/03/21 1215  ceFAZolin (ANCEF) 2-4 GM/100ML-% IVPB       Note to Pharmacy: Renda Rolls L: cabinet override      12/03/21 1215 12/04/21 0029       Discharge Exam: Blood pressure 115/81, pulse 81, temperature 98 F (36.7 C), temperature source Oral, resp. rate 18, height '5\' 8"'$  (1.727 m), weight 70.3 kg, SpO2 96 %. Neurologic: Grossly normal Ambulating and voiding well incision cdi   Discharge Medications:   Allergies as of 12/04/2021   No Known Allergies      Medication List     STOP taking these medications    divalproex 500 MG 24 hr tablet Commonly known as: DEPAKOTE ER   donepezil 10 MG tablet Commonly known as: ARICEPT   HYDROcodone-acetaminophen 10-325 MG tablet Commonly known as: NORCO   lisinopril 2.5 MG tablet Commonly known as: ZESTRIL   lurasidone 40 MG Tabs tablet Commonly known as: LATUDA   metoprolol tartrate 25 MG tablet Commonly known as: LOPRESSOR   rosuvastatin 40 MG tablet Commonly known as: CRESTOR       TAKE these medications    Carboxymethylcellulose Sodium 0.25 % Soln Place 1 drop into both eyes 2 (two) times daily as needed (dry eyes/irritation).   cetaphil cream Apply 1 application. topically 2 (two) times daily as needed (dry skin).    clobetasol cream 0.05 % Commonly known as: TEMOVATE Apply 1 application. topically 2 (two) times daily as needed (rash).   diclofenac Sodium 1 % Gel Commonly known as: VOLTAREN Apply 1 application. topically 3 (three) times daily as needed (pain).   hydrOXYzine 50 MG capsule Commonly known as: VISTARIL Take 50 mg by mouth 2 (two) times daily as needed for anxiety.   lidocaine 5 % Commonly known as: LIDODERM Place 1 patch onto the skin 2 (two) times daily as needed (pain). Remove & Discard patch within 12 hours or as directed by MD   methocarbamol 500 MG tablet Commonly known as: ROBAXIN Take 1 tablet (500 mg total) by mouth every 6 (six) hours as needed for muscle spasms. What changed:  when to take this reasons to take this   oxyCODONE-acetaminophen 5-325 MG tablet Commonly known as: Percocet Take 1 tablet  by mouth every 4 (four) hours as needed for severe pain.   OZEMPIC (1 MG/DOSE) [place] Inject 1 mg into the skin every Saturday.   rizatriptan 5 MG tablet Commonly known as: MAXALT Take 5 mg by mouth See admin instructions. Take one tablet (5 mg) by mouth at onset of migraine headache, may repeat in 2 hours if needed.               Durable Medical Equipment  (From admission, onward)           Start     Ordered   12/03/21 2014  DME Walker rolling  Once       Question:  Patient needs a walker to treat with the following condition  Answer:  S/P lumbar fusion   12/03/21 2013   12/03/21 2014  DME 3 n 1  Once        12/03/21 2013            Disposition: home   Final Dx: lumbar fusion T11-L4  Discharge Instructions      Remove dressing in 72 hours   Complete by: As directed    Call MD for:  difficulty breathing, headache or visual disturbances   Complete by: As directed    Call MD for:  hives   Complete by: As directed    Call MD for:  persistant dizziness or light-headedness   Complete by: As directed    Call MD for:  persistant nausea and  vomiting   Complete by: As directed    Call MD for:  redness, tenderness, or signs of infection (pain, swelling, redness, odor or green/yellow discharge around incision site)   Complete by: As directed    Call MD for:  severe uncontrolled pain   Complete by: As directed    Call MD for:  temperature >100.4   Complete by: As directed    Diet - low sodium heart healthy   Complete by: As directed    Driving Restrictions   Complete by: As directed    No driving for 2 weeks, no riding in the car for 1 week   Increase activity slowly   Complete by: As directed    Lifting restrictions   Complete by: As directed    No lifting more than 8 lbs          Signed: Ocie Cornfield Naarah Borgerding 12/04/2021, 3:00 PM

## 2021-12-04 NOTE — Plan of Care (Signed)
Pt is doing well. Pain is controlled. Pt has walked multiple times. No concerns.  Problem: Education: Goal: Knowledge of General Education information will improve Description: Including pain rating scale, medication(s)/side effects and non-pharmacologic comfort measures Outcome: Progressing   Problem: Health Behavior/Discharge Planning: Goal: Ability to manage health-related needs will improve Outcome: Progressing   Problem: Clinical Measurements: Goal: Ability to maintain clinical measurements within normal limits will improve Outcome: Progressing Goal: Will remain free from infection Outcome: Progressing Goal: Diagnostic test results will improve Outcome: Progressing Goal: Respiratory complications will improve Outcome: Progressing Goal: Cardiovascular complication will be avoided Outcome: Progressing   Problem: Activity: Goal: Risk for activity intolerance will decrease Outcome: Progressing   Problem: Nutrition: Goal: Adequate nutrition will be maintained Outcome: Progressing   Problem: Coping: Goal: Level of anxiety will decrease Outcome: Progressing   Problem: Elimination: Goal: Will not experience complications related to bowel motility Outcome: Progressing Goal: Will not experience complications related to urinary retention Outcome: Progressing   Problem: Pain Managment: Goal: General experience of comfort will improve Outcome: Progressing   Problem: Safety: Goal: Ability to remain free from injury will improve Outcome: Progressing   Problem: Skin Integrity: Goal: Risk for impaired skin integrity will decrease Outcome: Progressing   Problem: Education: Goal: Ability to describe self-care measures that may prevent or decrease complications (Diabetes Survival Skills Education) will improve Outcome: Progressing Goal: Individualized Educational Video(s) Outcome: Progressing   Problem: Coping: Goal: Ability to adjust to condition or change in health will  improve Outcome: Progressing   Problem: Fluid Volume: Goal: Ability to maintain a balanced intake and output will improve Outcome: Progressing   Problem: Health Behavior/Discharge Planning: Goal: Ability to identify and utilize available resources and services will improve Outcome: Progressing Goal: Ability to manage health-related needs will improve Outcome: Progressing   Problem: Metabolic: Goal: Ability to maintain appropriate glucose levels will improve Outcome: Progressing   Problem: Nutritional: Goal: Maintenance of adequate nutrition will improve Outcome: Progressing Goal: Progress toward achieving an optimal weight will improve Outcome: Progressing   Problem: Skin Integrity: Goal: Risk for impaired skin integrity will decrease Outcome: Progressing   Problem: Tissue Perfusion: Goal: Adequacy of tissue perfusion will improve Outcome: Progressing

## 2021-12-04 NOTE — Evaluation (Signed)
Physical Therapy Evaluation Patient Details Name: Cody Berger MRN: [id number] DOB: 29-Apr-1966 Today's Date: 12/04/2021  History of Present Illness  56 y.o. male admitted for L2 fracture with leg weakness 12/02/21 .Pt experienced fall possibly due to seizure 1 week prior with ER visit CT at that time revealed L2 burst fracture, pain resolved with TLSO and pain medication. s/p Open reduction internal fixation lumbar two fracture, instrumented fusion Thoracic Eleven-Lumbar Four 5/31 PMH: DM, PTSD  Clinical Impression  PTA pt living with wife in single story apartment with level entry. Pt ambulated with cane in home when experiencing hip pain and always with cane in community. Pt independent in ADLs and iADLs. Pt is currently limited in safe mobility by generalized weakness in LE and associated decreased balance in presence of low back surgical pain. Pt does report pain is less than prior to sx. PT is currently supervision for transfers and min guard for ambulation in hallway with RW. PT recommending RW for home use with no PT follow up.          Recommendations for follow up therapy are one component of a multi-disciplinary discharge planning process, led by the attending physician.  Recommendations may be updated based on patient status, additional functional criteria and insurance authorization.  Follow Up Recommendations No PT follow up    Assistance Recommended at Discharge Intermittent Supervision/Assistance  Patient can return home with the following  A little help with bathing/dressing/bathroom;A little help with walking and/or transfers;Assistance with cooking/housework;Assist for transportation    Equipment Recommendations Rolling walker (2 wheels)  Recommendations for Other Services  OT consult    Functional Status Assessment Patient has had a recent decline in their functional status and demonstrates the ability to make significant improvements in function in a reasonable and  predictable amount of time.     Precautions / Restrictions Precautions Precautions: Back Precaution Comments: educated on back precautions and reemphasized wtih mobility Required Braces or Orthoses: Spinal Brace Spinal Brace: Thoracolumbosacral orthotic;Applied in sitting position Restrictions Other Position/Activity Restrictions: Back Precautions      Mobility  Bed Mobility               General bed mobility comments: sitting on EoB    Transfers Overall transfer level: Modified independent Equipment used: Rolling walker (2 wheels)               General transfer comment: edcuated to push up from bed and then reach for RW    Ambulation/Gait Ambulation/Gait assistance: Supervision Gait Distance (Feet): 180 Feet Assistive device: Rolling walker (2 wheels) Gait Pattern/deviations: Step-through pattern, Decreased step length - right, Decreased step length - left, WFL(Within Functional Limits) Gait velocity: slowed Gait velocity interpretation: 1.31 - 2.62 ft/sec, indicative of limited community ambulator   General Gait Details: slow, steady gait, requires cuing for not twisting to see what is going on in hallway      Balance Overall balance assessment: Mild deficits observed, not formally tested                                           Pertinent Vitals/Pain Pain Assessment Pain Assessment: 0-10 Pain Score: 2  Pain Location: belly (gas pain)  and lower back Pain Descriptors / Indicators: Aching, Sore Pain Intervention(s): Limited activity within patient's tolerance, Monitored during session, Repositioned    Home Living Family/patient expects to be discharged to:: Private residence  Living Arrangements: Spouse/significant other Available Help at Discharge: Available 24 hours/day Type of Home: Apartment Home Access: Level entry       Home Layout: One level Home Equipment: Shower seat;Transport chair;Grab bars - tub/shower       Prior Function Prior Level of Function : Independent/Modified Independent             Mobility Comments: uses cane in home when hips are bothering him, and always in the community ADLs Comments: independent        Extremity/Trunk Assessment   Upper Extremity Assessment Upper Extremity Assessment: Defer to OT evaluation    Lower Extremity Assessment Lower Extremity Assessment: Generalized weakness    Cervical / Trunk Assessment Cervical / Trunk Assessment: Back Surgery  Communication   Communication: No difficulties (very talkative)  Cognition Arousal/Alertness: Awake/alert Behavior During Therapy: WFL for tasks assessed/performed Overall Cognitive Status: Within Functional Limits for tasks assessed                                          General Comments General comments (skin integrity, edema, etc.): VSS on RA, Wife present during session, encouraged short bouts of ambulation every hour        Assessment/Plan    PT Assessment Patient needs continued PT services  PT Problem List Decreased balance;Decreased knowledge of use of DME;Decreased activity tolerance;Pain       PT Treatment Interventions DME instruction;Gait training;Functional mobility training;Therapeutic activities;Therapeutic exercise;Balance training;Patient/family education    PT Goals (Current goals can be found in the Care Plan section)  Acute Rehab PT Goals Patient Stated Goal: go home ASAP PT Goal Formulation: With patient/family Time For Goal Achievement: 12/18/21 Potential to Achieve Goals: Good    Frequency Min 3X/week        AM-PAC PT "6 Clicks" Mobility  Outcome Measure Help needed turning from your back to your side while in a flat bed without using bedrails?: A Little Help needed moving from lying on your back to sitting on the side of a flat bed without using bedrails?: A Little Help needed moving to and from a bed to a chair (including a wheelchair)?:  None Help needed standing up from a chair using your arms (e.g., wheelchair or bedside chair)?: None Help needed to walk in hospital room?: None Help needed climbing 3-5 steps with a railing? : A Little 6 Click Score: 21    End of Session Equipment Utilized During Treatment: Gait belt;Back brace Activity Tolerance: Patient tolerated treatment well Patient left: Other (comment);with call bell/phone within reach;with family/visitor present (sitting EoB) Nurse Communication: Mobility status PT Visit Diagnosis: Other abnormalities of gait and mobility (R26.89);Difficulty in walking, not elsewhere classified (R26.2);Pain Pain - part of body:  (low back pain)    Time: 4709-6283 PT Time Calculation (min) (ACUTE ONLY): 34 min   Charges:   PT Evaluation $PT Eval Moderate Complexity: 1 Mod PT Treatments $Gait Training: 8-22 mins        Ambria Mayfield B. Migdalia Dk PT, DPT Acute Rehabilitation Services Please use secure chat or  Call Office [phone]   [place] 12/04/2021, 10:56 AM

## 2021-12-04 NOTE — Anesthesia Postprocedure Evaluation (Signed)
Anesthesia Post Note  Patient: Cody Berger  Procedure(s) Performed: Open reduction internal fixation lumbar two fracture, instrumented fusion Thoracic Eleven-Lumbar Four     Patient location during evaluation: PACU Anesthesia Type: General Level of consciousness: awake and alert Pain management: pain level controlled Vital Signs Assessment: post-procedure vital signs reviewed and stable Respiratory status: spontaneous breathing, nonlabored ventilation, respiratory function stable and patient connected to nasal cannula oxygen Cardiovascular status: blood pressure returned to baseline and stable Postop Assessment: no apparent nausea or vomiting Anesthetic complications: no   No notable events documented.  Last Vitals:  Vitals:   12/03/21 1942 12/03/21 2344  BP: 110/64 121/74  Pulse: 95 91  Resp: 18 17  Temp: 36.7 C 37.1 C  SpO2: 98%     Last Pain:  Vitals:   12/04/21 0017  TempSrc:   PainSc: 2                  March Rummage Aalyssa Elderkin

## 2021-12-04 NOTE — Progress Notes (Signed)
Inpatient Rehabilitation Admissions Coordinator   Noted PT eval. No indication for a CIR admit. We will sign off.  Danne Baxter, RN, MSN Rehab Admissions Coordinator [phone] 12/04/2021 11:23 AM

## 2021-12-04 NOTE — Progress Notes (Signed)
Subjective: Patient reports appropriate mild back soreness.  States his legs feel better.  States the numbness in his legs is better.  He has not mobilized because his brace never came back to his room with him after surgery.  Objective: Vital signs in last 24 hours: Temp:  [97.7 F (36.5 C)-98.7 F (37.1 C)] 98.7 F (37.1 C) (05/31 2344) Pulse Rate:  [78-104] 91 (05/31 2344) Resp:  [13-21] 17 (05/31 2344) BP: (110-131)/(64-87) 121/74 (05/31 2344) SpO2:  [93 %-98 %] 98 % (05/31 1942) Weight:  [70.3 kg] 70.3 kg (05/31 1240)  Intake/Output from previous day: 05/31 0701 - 06/01 0700 In: 3438.8 [P.O.:357; I.V.:2881.8; IV Piggyback:200] Out: 2947 [Urine:1080; Blood:175] Intake/Output this shift: No intake/output data recorded.  Neurologic: Grossly normal, with much improved proximal lower extremity leg strength and normal distal leg strength with good sensation  Lab Results: Lab Results  Component Value Date   WBC 9.2 12/03/2021   HGB 14.8 12/03/2021   HCT 42.4 12/03/2021   MCV 93.6 12/03/2021   PLT 322 12/03/2021   Lab Results  Component Value Date   INR 1.1 12/02/2021   BMET Lab Results  Component Value Date   NA 136 11/26/2021   K 4.2 11/26/2021   CL 102 11/26/2021   CO2 24 11/26/2021   GLUCOSE 275 (H) 11/26/2021   BUN 19 11/26/2021   CREATININE 0.92 11/26/2021   CALCIUM 9.4 11/26/2021    Studies/Results: DG THORACOLUMABAR SPINE  Result Date: 12/03/2021 CLINICAL DATA:  ORIF L2 fracture EXAM: THORACOLUMBAR SPINE 1V COMPARISON:  Lumbar spine radiographs 11/24/2021. Lumbar MRI 12/02/2021 FINDINGS: PA and lateral images lumbar spine were obtained. Moderate compression fracture of L2 noted. Pedicle screw and posterior rod fusion extending from T11 through L4. Hardware in satisfactory position without complication. IMPRESSION: Pedicle screw and rod lumbar fusion for compression fracture of L2. Electronically Signed   By: Franchot Gallo M.D.   On: 12/03/2021 17:56   MR  THORACIC SPINE WO CONTRAST  Result Date: 12/03/2021 CLINICAL DATA:  Leg weakness, lumbar spine fracture EXAM: MRI THORACIC SPINE WITHOUT CONTRAST TECHNIQUE: Multiplanar, multisequence MR imaging of the thoracic spine was performed. No intravenous contrast was administered. COMPARISON:  None Available. FINDINGS: Alignment:  No significant listhesis. Vertebrae: T12 fracture as identified on prior imaging. Fracture line underlies the superior endplate with STIR hyperintensity but no substantial surrounding edema. Height loss is minor. Chronic mild T4 compression fracture with anterior wedging. There is also mild loss of height at additional levels with superimposed degenerative endplate irregularity and mild degenerative endplate marrow edema. Cord:  No abnormal signal. Paraspinal and other soft tissues: Unremarkable. Disc levels: Minor degenerative disc disease. For example trace disc bulges at T3-T4 and T4-T5. There is mild facet arthropathy. No canal or foraminal stenosis at any level. IMPRESSION: Likely subacute T12 fracture as identified previously. No additional acute or subacute thoracic spine fracture. Electronically Signed   By: Macy Mis M.D.   On: 12/03/2021 09:16   MR LUMBAR SPINE WO CONTRAST  Result Date: 12/02/2021 CLINICAL DATA:  L2 compression fracture and like weakness. EXAM: MRI LUMBAR SPINE WITHOUT CONTRAST TECHNIQUE: Multiplanar, multisequence MR imaging of the lumbar spine was performed. No intravenous contrast was administered. COMPARISON:  11/24/2021 CT scan FINDINGS: Segmentation: The lowest lumbar type non-rib-bearing vertebra is labeled as L5. Alignment:  No vertebral subluxation is observed. Vertebrae: As noted on prior CT, there is an L2 burst fracture with 9 mm posterior bony retropulsion as shown on image 10 series 2, and a proximally  60% loss of vertebral body height. There is diffuse edema signal in the L2 vertebral body. In addition, there is a subtle superior endplate  compression fracture at L4 with 10% loss of vertebral body heights, and marrow edema along the superior endplate compression. The fracture extends to the middle column/posterior vertebral body margin although there is no posterior bony retropulsion. In addition, there is a transverse fracture through the upper portion of the T12 vertebral body. This likewise extends to the posterior vertebral margin and accordingly involves the anterior and middle columns. There is about 10% loss of vertebral body height at T12. Conus medullaris and cauda equina: Conus extends to the L2 level. Conus and cauda equina appear normal. Paraspinal and other soft tissues: The compression fractures in the lower thoracic and lumbar spine orally associated with subtle paraspinal edema. Disc levels: T11-12: No impingement. This level is only included on the parasagittal images. T12-L1: Unremarkable L1-2: Borderline left subarticular lateral recess stenosis on image 15 series 5. At the level of maximum posterior bony retropulsion, the AP diameter of the thecal sac is 1.1 cm. L2-3: Unremarkable L3-4: Unremarkable L4-5: No impingement. Mild disc bulge. Small left foraminal annular tear. L5-S1: No impingement.  Minimal disc bulge. IMPRESSION: 1. Subacute L2 burst fracture with 9 mm posterior bony retropulsion and about 60% loss of vertebral body height. The posterior bony retropulsion is associated with equivocal left lateral recess stenosis just below the L1-2 level. 2. Subacute superior endplate compression fracture at L4 involving the anterior and middle columns, without substantial bony retropulsion. 10% loss of vertebral height. 3. Subacute transverse upper vertebral fracture at T12 involvement of the anterior and middle columns, no retropulsion. 10% loss of vertebral height. Electronically Signed   By: Van Clines M.D.   On: 12/02/2021 21:25   DG C-Arm 1-60 Min-No Report  Result Date: 12/03/2021 Fluoroscopy was utilized by the  requesting physician.  No radiographic interpretation.   DG C-Arm 1-60 Min-No Report  Result Date: 12/03/2021 Fluoroscopy was utilized by the requesting physician.  No radiographic interpretation.   DG C-Arm 1-60 Min-No Report  Result Date: 12/03/2021 Fluoroscopy was utilized by the requesting physician.  No radiographic interpretation.    Assessment/Plan: Doing well postop day 1 after T11-L4 open reduction internal fixation of L2 fracture, improved leg strength and numbness.  I went to the waiting room and found his brace and have delivered it to his room.  Mobilize today with physical and Occupational Therapy.  Continue pain control.  We felt like he had poor bone quality by imaging and when we were putting in his screws, and therefore I think he is going to need a DEXA scan and likely treatment for osteoporosis.  Estimated body mass index is 23.57 kg/m as calculated from the following:   Height as of this encounter: '5\' 8"'$  (1.727 m).   Weight as of this encounter: 70.3 kg.    LOS: 2 days    Eustace Moore 12/04/2021, 7:55 AM

## 2021-12-04 NOTE — Progress Notes (Signed)
Inpatient Diabetes Program Recommendations  AACE/ADA: New Consensus Statement on Inpatient Glycemic Control (2015)  Target Ranges:  Prepandial:   less than 140 mg/dL      Peak postprandial:   less than 180 mg/dL (1-2 hours)      Critically ill patients:  140 - 180 mg/dL   Lab Results  Component Value Date   GLUCAP 308 (H) 12/04/2021   HGBA1C 7.8 (H) 12/02/2021    Review of Glycemic Control  Latest Reference Range & Units 12/03/21 06:00 12/03/21 11:20 12/03/21 13:36 12/03/21 16:13 12/03/21 17:41 12/04/21 12:00  Glucose-Capillary 70 - 99 mg/dL 250 (H) 201 (H) 169 (H) 201 (H) 218 (H) 308 (H)   Diabetes history: DM 2 Outpatient Diabetes medications:  Ozempic 1 mg weekly (Saturday) Current orders for Inpatient glycemic control:  Novolog moderate tid with meals  Inpatient Diabetes Program Recommendations:    Blood sugars likely increased due to  steroids.  Consider adding Levemir 6 units bid.   Thanks,  Adah Perl, RN, BC-ADM Inpatient Diabetes Coordinator Pager [phone]  (8a-5p)

## 2021-12-07 ENCOUNTER — Inpatient Hospital Stay (HOSPITAL_COMMUNITY)
Admission: EM | Admit: 2021-12-07 | Discharge: 2021-12-15 | DRG: 029 | Disposition: A | Discharge status: Home Health | Payer: No Typology Code available for payment source | Attending: Neurological Surgery | Admitting: Neurological Surgery

## 2021-12-07 ENCOUNTER — Inpatient Hospital Stay (HOSPITAL_COMMUNITY): Payer: No Typology Code available for payment source

## 2021-12-07 ENCOUNTER — Other Ambulatory Visit: Payer: Self-pay

## 2021-12-07 ENCOUNTER — Emergency Department (HOSPITAL_COMMUNITY): Payer: No Typology Code available for payment source

## 2021-12-07 ENCOUNTER — Encounter (HOSPITAL_COMMUNITY): Payer: Self-pay | Admitting: Emergency Medicine

## 2021-12-07 DIAGNOSIS — Z85828 Personal history of other malignant neoplasm of skin: Secondary | ICD-10-CM | POA: Diagnosis not present

## 2021-12-07 DIAGNOSIS — S32022A Unstable burst fracture of second lumbar vertebra, initial encounter for closed fracture: Secondary | ICD-10-CM | POA: Diagnosis present

## 2021-12-07 DIAGNOSIS — M5459 Other low back pain: Secondary | ICD-10-CM | POA: Diagnosis present

## 2021-12-07 DIAGNOSIS — S22089A Unspecified fracture of T11-T12 vertebra, initial encounter for closed fracture: Secondary | ICD-10-CM | POA: Diagnosis present

## 2021-12-07 DIAGNOSIS — F17210 Nicotine dependence, cigarettes, uncomplicated: Secondary | ICD-10-CM | POA: Diagnosis present

## 2021-12-07 DIAGNOSIS — Z981 Arthrodesis status: Secondary | ICD-10-CM

## 2021-12-07 DIAGNOSIS — Y838 Other surgical procedures as the cause of abnormal reaction of the patient, or of later complication, without mention of misadventure at the time of the procedure: Secondary | ICD-10-CM | POA: Diagnosis present

## 2021-12-07 DIAGNOSIS — G9741 Accidental puncture or laceration of dura during a procedure: Secondary | ICD-10-CM | POA: Diagnosis present

## 2021-12-07 DIAGNOSIS — X58XXXA Exposure to other specified factors, initial encounter: Secondary | ICD-10-CM | POA: Diagnosis present

## 2021-12-07 DIAGNOSIS — S32049A Unspecified fracture of fourth lumbar vertebra, initial encounter for closed fracture: Secondary | ICD-10-CM | POA: Diagnosis present

## 2021-12-07 DIAGNOSIS — S32001A Stable burst fracture of unspecified lumbar vertebra, initial encounter for closed fracture: Secondary | ICD-10-CM

## 2021-12-07 DIAGNOSIS — G96198 Other disorders of meninges, not elsewhere classified: Secondary | ICD-10-CM | POA: Diagnosis present

## 2021-12-07 DIAGNOSIS — Z7985 Long-term (current) use of injectable non-insulin antidiabetic drugs: Secondary | ICD-10-CM

## 2021-12-07 DIAGNOSIS — G9600 Cerebrospinal fluid leak, unspecified: Secondary | ICD-10-CM | POA: Diagnosis present

## 2021-12-07 DIAGNOSIS — E119 Type 2 diabetes mellitus without complications: Secondary | ICD-10-CM | POA: Diagnosis not present

## 2021-12-07 DIAGNOSIS — Z79899 Other long term (current) drug therapy: Secondary | ICD-10-CM

## 2021-12-07 DIAGNOSIS — G9609 Other spinal cerebrospinal fluid leak: Principal | ICD-10-CM | POA: Diagnosis present

## 2021-12-07 DIAGNOSIS — I10 Essential (primary) hypertension: Secondary | ICD-10-CM | POA: Diagnosis not present

## 2021-12-07 DIAGNOSIS — Z7984 Long term (current) use of oral hypoglycemic drugs: Secondary | ICD-10-CM | POA: Diagnosis not present

## 2021-12-07 LAB — COMPREHENSIVE METABOLIC PANEL
ALT: 24 U/L (ref 0–44)
AST: 21 U/L (ref 15–41)
Albumin: 2.6 g/dL — ABNORMAL LOW (ref 3.5–5.0)
Alkaline Phosphatase: 95 U/L (ref 38–126)
Anion gap: 15 (ref 5–15)
BUN: 15 mg/dL (ref 6–20)
CO2: 18 mmol/L — ABNORMAL LOW (ref 22–32)
Calcium: 8.7 mg/dL — ABNORMAL LOW (ref 8.9–10.3)
Chloride: 101 mmol/L (ref 98–111)
Creatinine, Ser: 0.77 mg/dL (ref 0.61–1.24)
GFR, Estimated: >60 mL/min (ref >60)
Glucose, Bld: 219 mg/dL — ABNORMAL HIGH (ref 70–99)
Potassium: 3.6 mmol/L (ref 3.5–5.1)
Sodium: 134 mmol/L — ABNORMAL LOW (ref 135–145)
Total Bilirubin: 1.2 mg/dL (ref 0.3–1.2)
Total Protein: 5.9 g/dL — ABNORMAL LOW (ref 6.5–8.1)

## 2021-12-07 LAB — CBC WITH DIFFERENTIAL/PLATELET
Abs Immature Granulocytes: 0.12 10*3/uL — ABNORMAL HIGH (ref 0.00–0.07)
Basophils Absolute: 0 10*3/uL (ref 0.0–0.1)
Basophils Relative: 0 %
Eosinophils Absolute: 0 10*3/uL (ref 0.0–0.5)
Eosinophils Relative: 0 %
HCT: 35.4 % — ABNORMAL LOW (ref 39.0–52.0)
Hemoglobin: 12.4 g/dL — ABNORMAL LOW (ref 13.0–17.0)
Immature Granulocytes: 1 %
Lymphocytes Relative: 6 %
Lymphs Abs: 0.9 10*3/uL (ref 0.7–4.0)
MCH: 32.5 pg (ref 26.0–34.0)
MCHC: 35 g/dL (ref 30.0–36.0)
MCV: 92.7 fL (ref 80.0–100.0)
Monocytes Absolute: 1 10*3/uL (ref 0.1–1.0)
Monocytes Relative: 7 %
Neutro Abs: 13.5 10*3/uL — ABNORMAL HIGH (ref 1.7–7.7)
Neutrophils Relative %: 86 %
Platelets: 370 10*3/uL (ref 150–400)
RBC: 3.82 MIL/uL — ABNORMAL LOW (ref 4.22–5.81)
RDW: 11.9 % (ref 11.5–15.5)
WBC: 15.5 10*3/uL — ABNORMAL HIGH (ref 4.0–10.5)
nRBC: 0 % (ref 0.0–0.2)

## 2021-12-07 LAB — CBG MONITORING, ED: Glucose-Capillary: 252 mg/dL — ABNORMAL HIGH (ref 70–99)

## 2021-12-07 LAB — URINALYSIS, ROUTINE W REFLEX MICROSCOPIC
Bacteria, UA: NONE SEEN
Bilirubin Urine: NEGATIVE
Glucose, UA: >=500 mg/dL — AB
Hgb urine dipstick: NEGATIVE
Ketones, ur: 80 mg/dL — AB
Leukocytes,Ua: NEGATIVE
Nitrite: NEGATIVE
Protein, ur: 30 mg/dL — AB
Specific Gravity, Urine: 1.023 (ref 1.005–1.030)
pH: 6 (ref 5.0–8.0)

## 2021-12-07 LAB — LACTIC ACID, PLASMA: Lactic Acid, Venous: 1.6 mmol/L (ref 0.5–1.9)

## 2021-12-07 IMAGING — CT CT L SPINE W/O CM
3 of 6 series · 10 of 35 positions shown, 11 images · non-contrast
Comparison: Comparison made with MRI performed earlier the same day
as well as multiple recent examinations.

CLINICAL DATA: Initial evaluation for increased back pain status
post recent trauma and spinal surgery.

EXAM:
CT THORACIC AND LUMBAR SPINE WITHOUT CONTRAST
TECHNIQUE: Multidetector CT imaging of the thoracic and lumbar spine was
performed without contrast. Multiplanar CT image reconstructions
were also generated.
RADIATION DOSE REDUCTION: This exam was performed according to the
departmental dose-optimization program which includes automated
exposure control, adjustment of the mA and/or kV according to
patient size and/or use of iterative reconstruction technique.

[Series 8: cor bone · coronal · 0.36mm/px · 1 of 76 slices shown]
[im 38/76  bone]
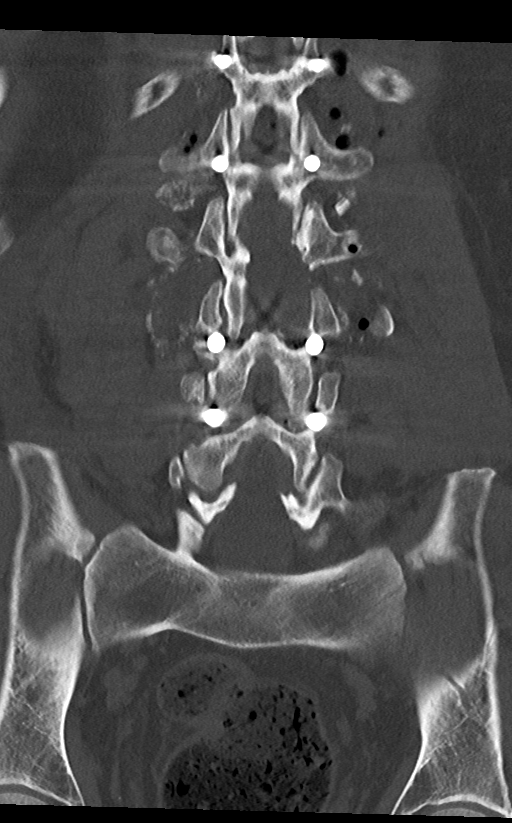

[Series 10: sag st · sagittal · 0.33mm/px · 6 of 94 slices shown]
[im 16/94  bone]
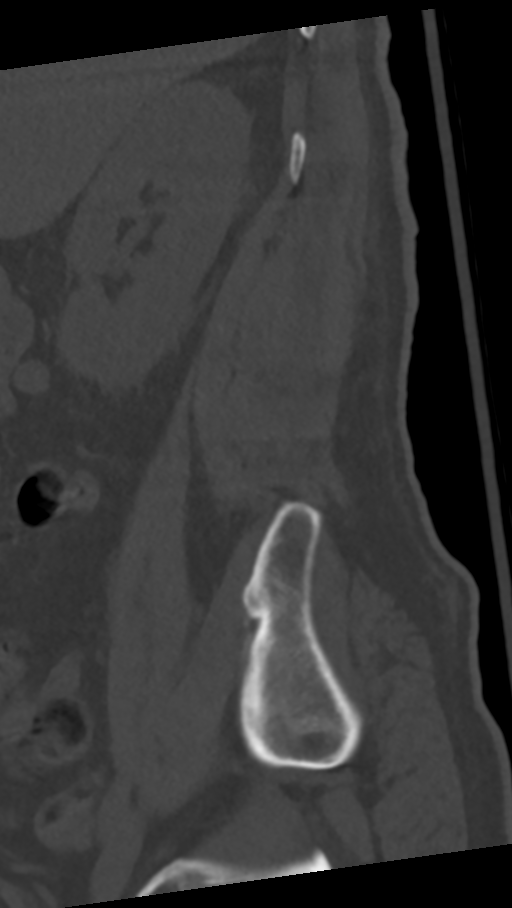
[im 32/94  bone]
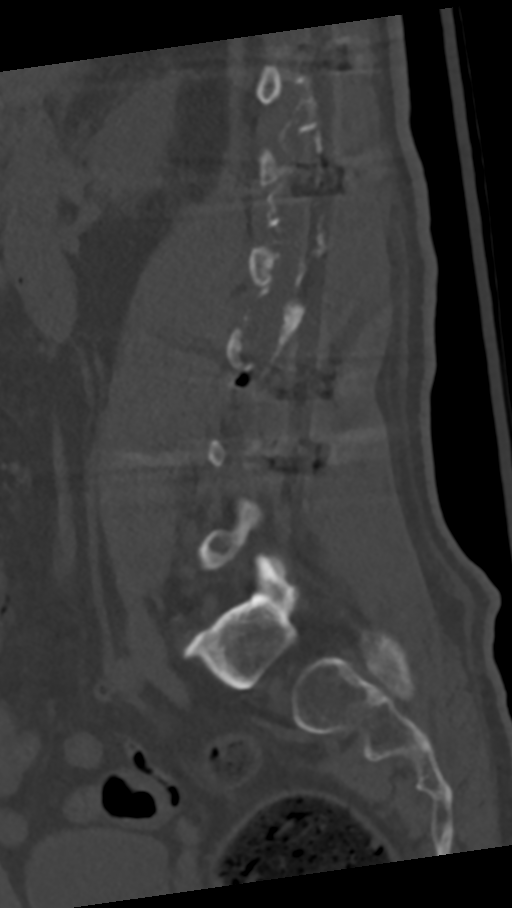
[im 47/94  bone]
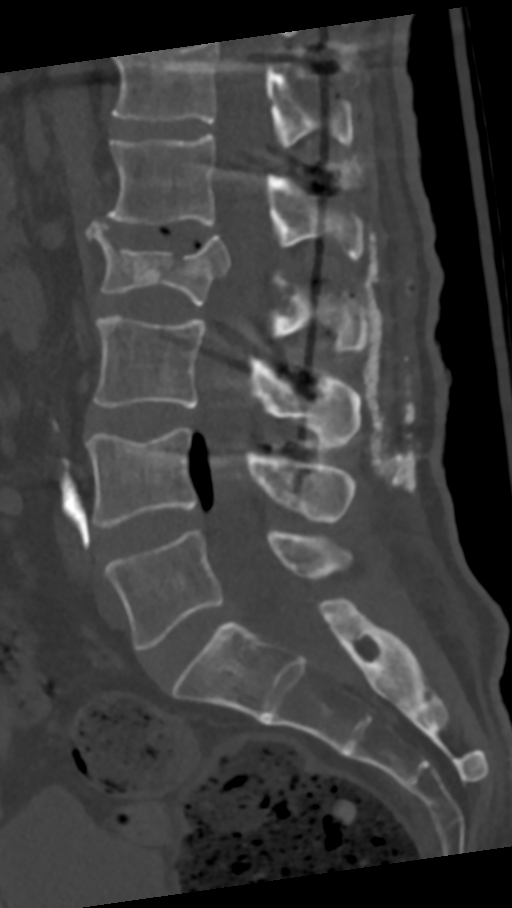
[im 63/94  bone]
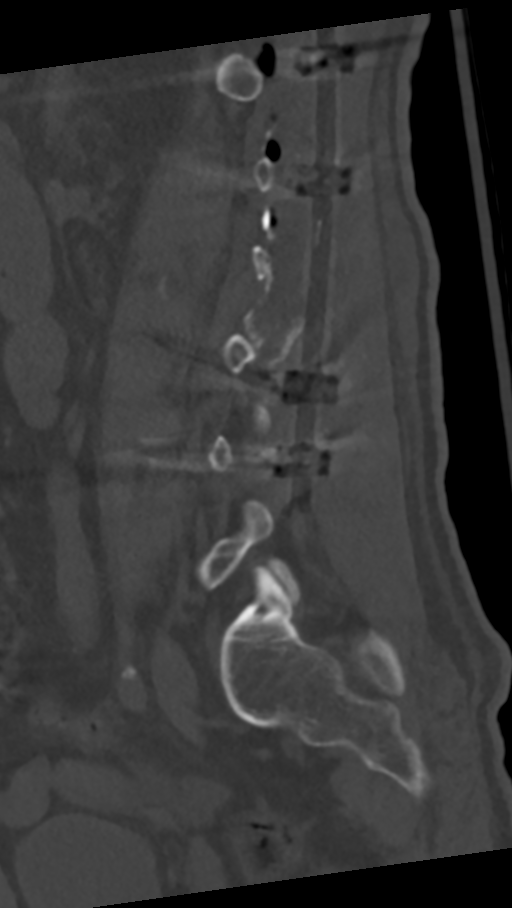
[im 78/94  bone]
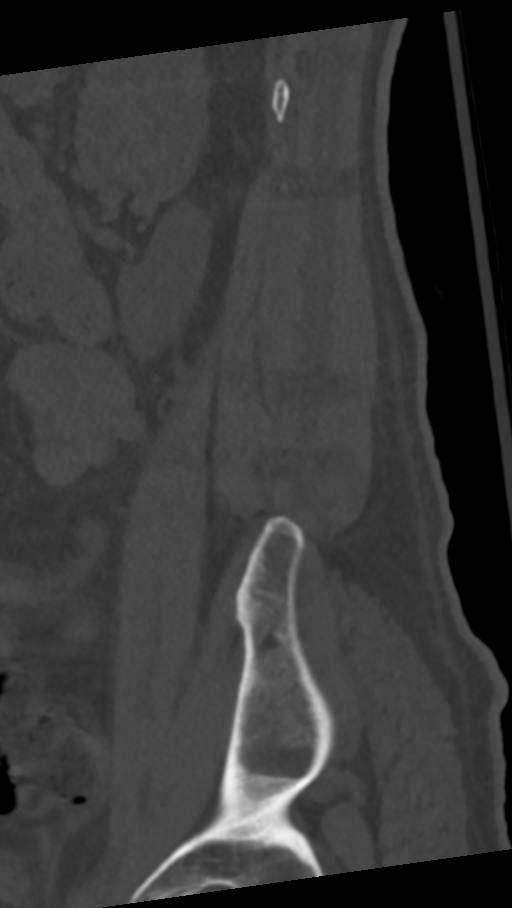
[im 86/94  soft-tissue]
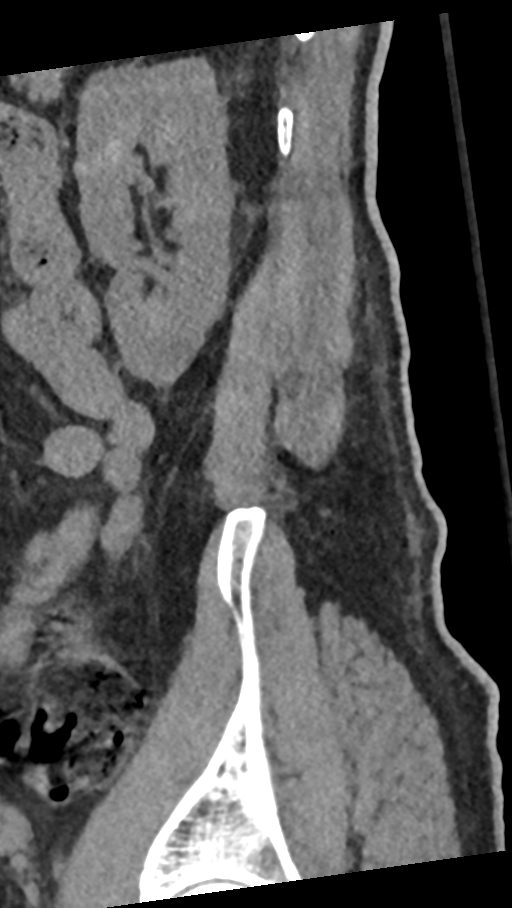

[Series 11: orthogonal · axial · 0.31mm/px · z∈[+231,+419]mm · 3 of 143 slices shown, 4 images]
[im 24/143  soft-tissue]
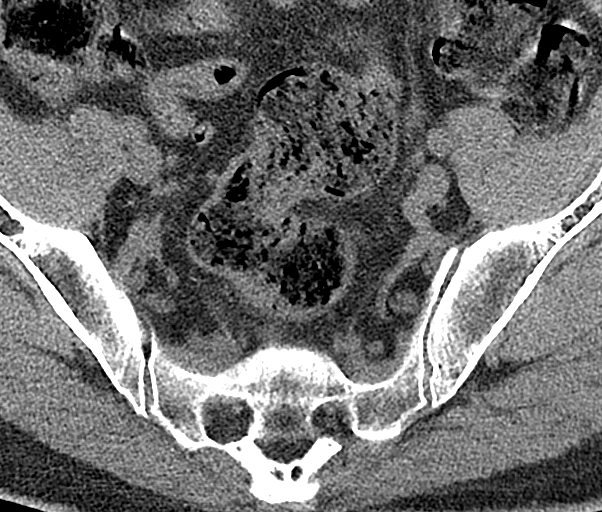
[im 24/143  bone]
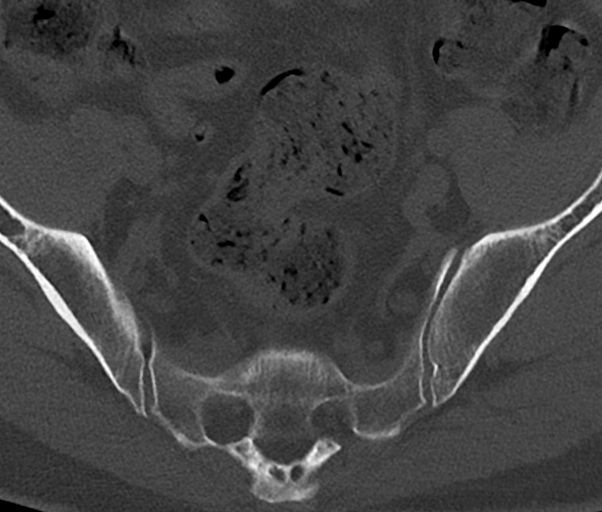
[im 72/143  bone]
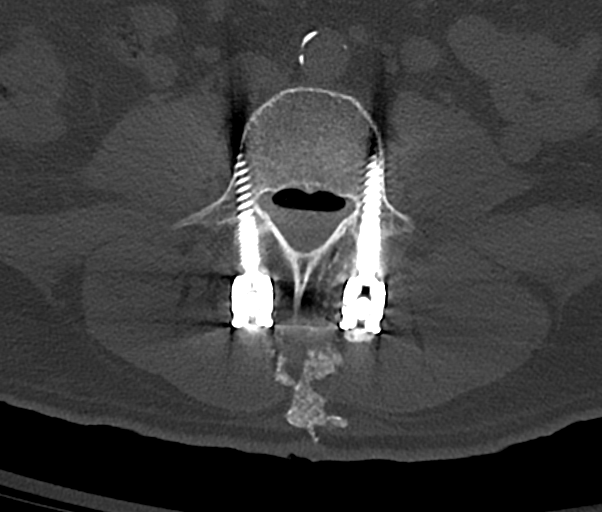
[im 119/143  bone]
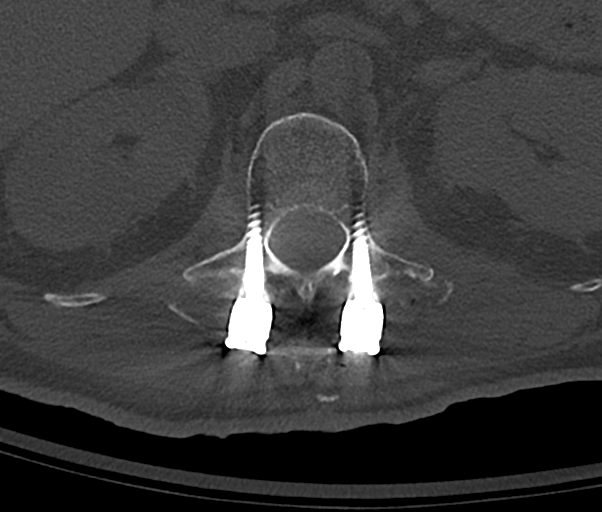

[10 of 35 positions shown; findings below may reference images not displayed]

FINDINGS: CT THORACIC SPINE FINDINGS

Alignment: Physiologic with preservation of the normal thoracic
kyphosis. No interval listhesis.

Vertebrae: Acute T12 vertebral body fracture without significant
height loss, grossly stable from previous. Vertebral body height
maintained with no other new or acute fracture. Posterior fusion
with bilateral transpedicular screws in place at T11 extending into
the upper lumbar spine. Visualized portions of the hardware are
intact. No periprosthetic lucency to suggest loosening. No discrete
or worrisome osseous lesions.

Paraspinal and other soft tissues: Scattered postoperative emphysema
present within the posterior paraspinous soft tissues. Dependent
atelectatic changes present within the right greater than left
lungs. 1.7 cm hyperdense lesion present at the interpolar left
kidney, nonspecific, but could reflect a proteinaceous and/or
hemorrhagic cyst (series 7, image 163).

Disc levels: No new or significant disc pathology seen within the
thoracic spine. No significant stenosis.

CT LUMBAR SPINE FINDINGS

Segmentation: Standard.

Alignment: Physiologic with preservation of the normal lumbar
lordosis. No interval listhesis or malalignment.

Vertebrae: Previously identified L2 burst type compression fracture
again seen, stable in appearance with stable height loss and bony
retropulsion. There has been interval fracturing of the left
transverse process of L2 (series 5, image 38). Acute compression
fracture involving L4 also relatively stable with stable height loss
without bony retropulsion.

Postoperative changes from interval posterior fusion at T11 through
L4, with bilateral transpedicular screws in place at T11 through L1,
and L3 and L4. Hardware is intact. The transpedicular screws at L3
abut the superior endplate of L3 without frank violation of the L2-3
interspace. No significant periprosthetic lucency to suggest
loosening.

Paraspinal and other soft tissues: Postoperative changes noted
within the posterior paraspinous soft tissues. Postoperative
collection better characterized on prior MRI. Superimposed allograft
material noted. Small amount of residual gas present within the
ventral thecal sac at the level of L4. Aortic atherosclerosis. Large
volume retained stool noted impacted within the rectal vault,
suggesting constipation.

Disc levels: Underlying mild degenerative spondylosis without
significant spinal stenosis, better characterized on recent MRI.
IMPRESSION: 1. Stable appearance of the acute L2 burst type compression fracture
as well as the additional compression fractures at T12 and L4.
Interval fracturing of the left transverse process of L2.
2. Postoperative changes from recent posterior fusion at T11 through
L4. Hardware intact without complication.
3. Postoperative changes throughout the lower thoracolumbar spine
with probable benign postoperative collection. Findings are better
characterized on recent MRI.
4. Large volume retained stool impacted within the rectal vault,
suggesting constipation.
5. 1.7 cm hyperdense lesion within the interpolar left kidney,
nonspecific. Follow-up examination with nonemergent renal mass
protocol CT and/or MRI suggested for further characterization.
6. Aortic Atherosclerosis ([RQ]-[RQ]).

## 2021-12-07 IMAGING — MR MR LUMBAR SPINE W/O CM
4 of 5 series · 18 of 48 positions shown · non-contrast
Comparison: [DATE]

CLINICAL DATA: Low back pain, cauda equina syndrome suspected

EXAM:
MRI LUMBAR SPINE WITHOUT CONTRAST
TECHNIQUE: Multiplanar, multisequence MR imaging of the lumbar spine was
performed. No intravenous contrast was administered.

[Series 4: T2 · sagittal · 4.0mm · 0.55mm/px · 5 of 17 slices shown (1 of 2)]
[im 1/17]
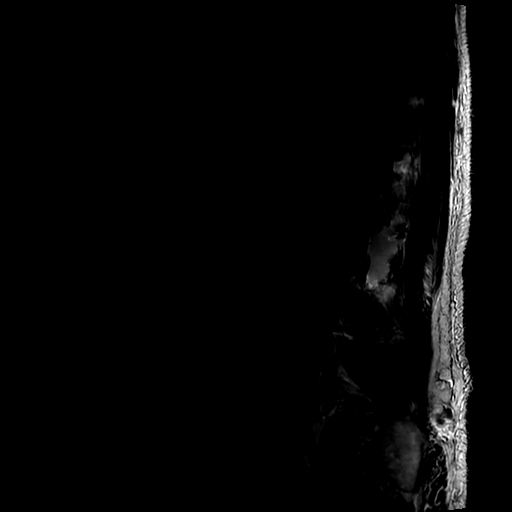
[im 5/17]
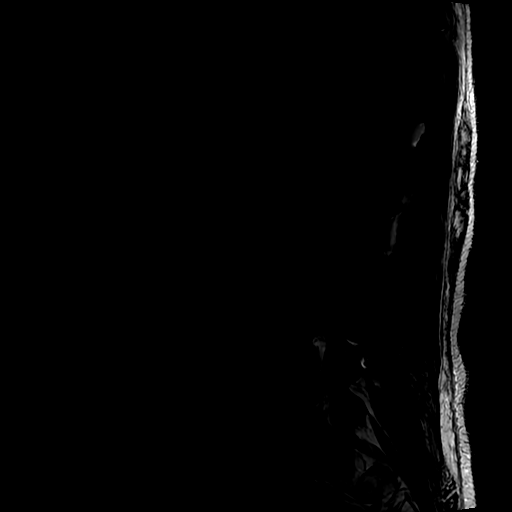
[im 9/17]
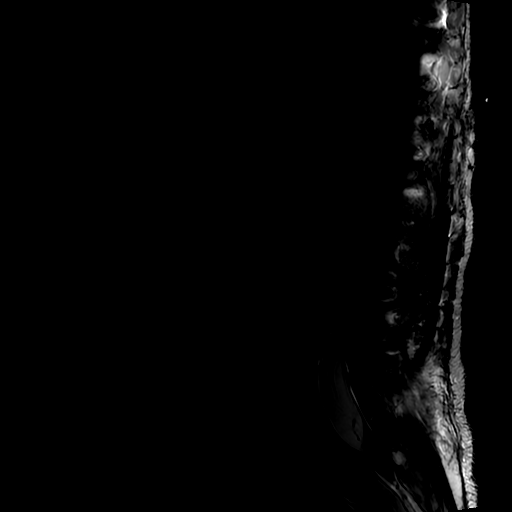
[im 13/17]
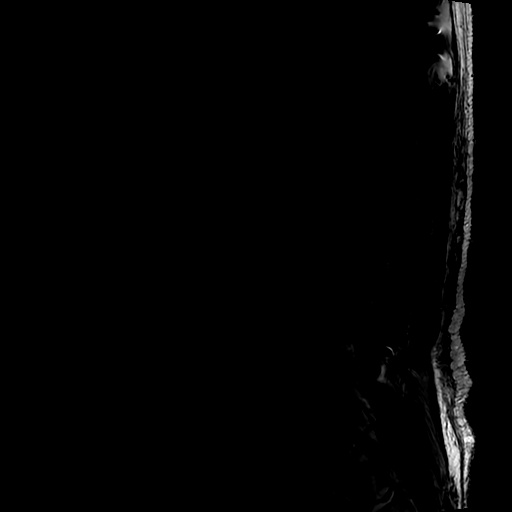
[im 17/17]
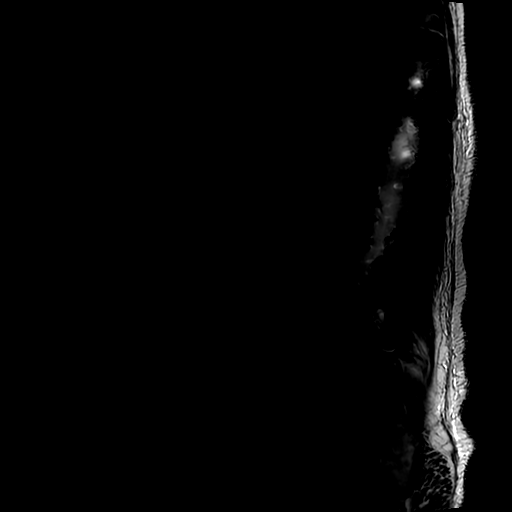

[Series 5: T1 · sagittal · 4.0mm · 0.55mm/px · 3 of 17 slices shown (1 of 2)]
[im 1/17]
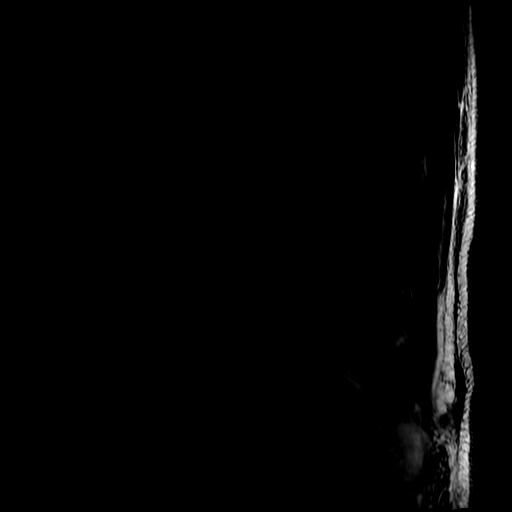
[im 9/17]
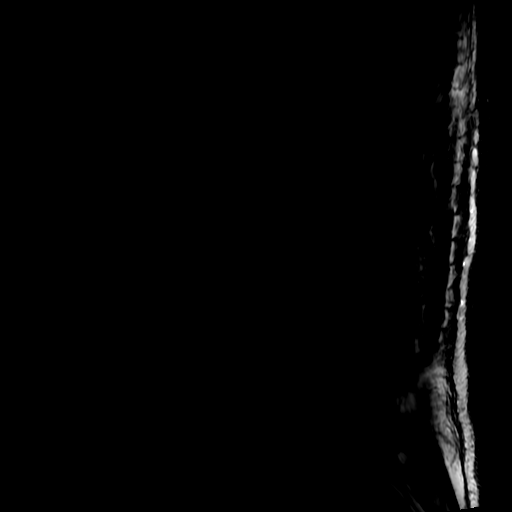
[im 17/17]
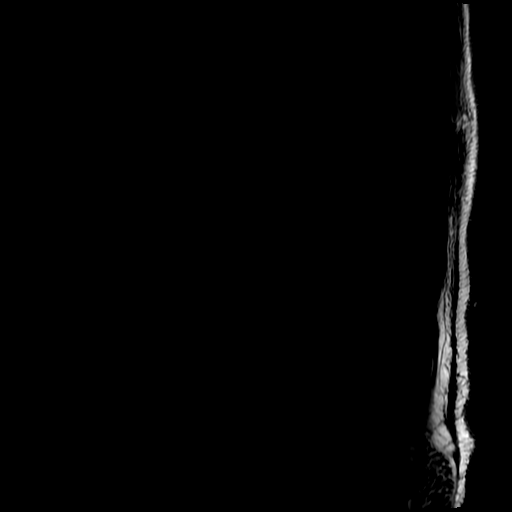

[Series 6: T2 · axial · 4.0mm · 0.39mm/px · z∈[-98,+101]mm · 7 of 51 slices shown (2 of 2)]
[im 4/51]
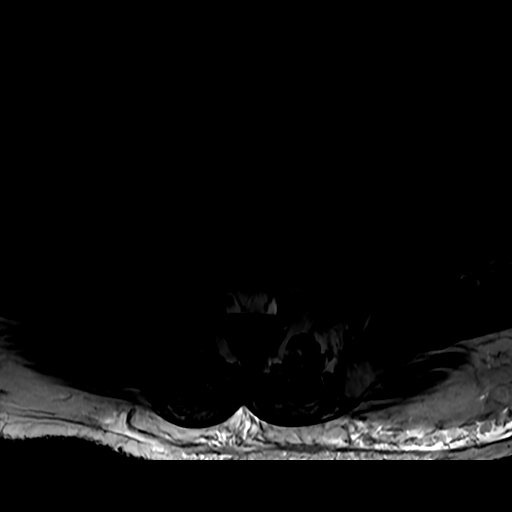
[im 7/51]
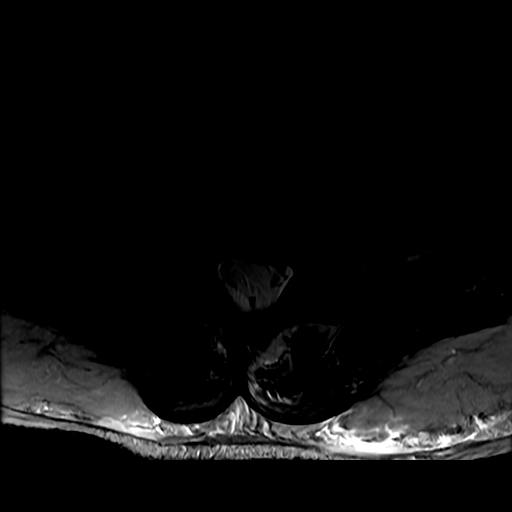
[im 11/51]
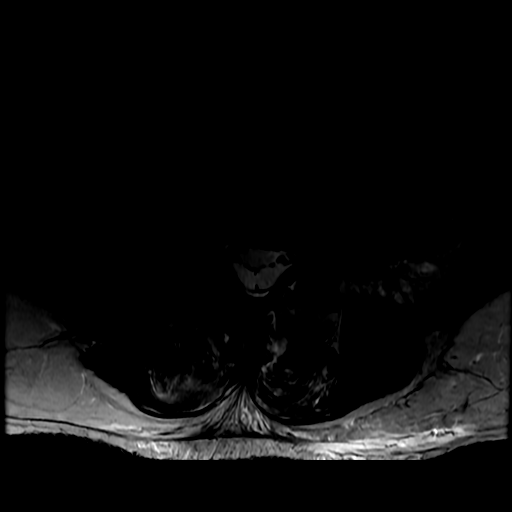
[im 17/51]
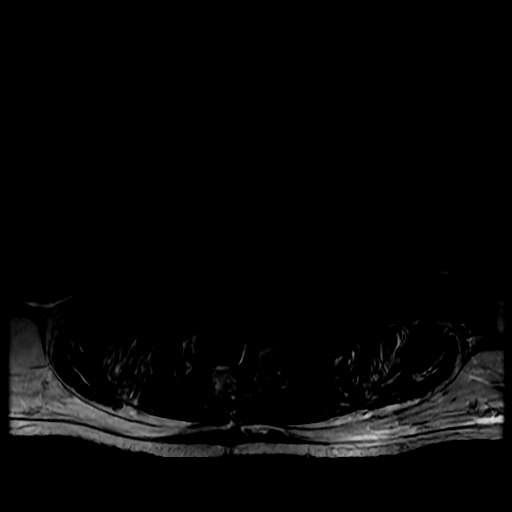
[im 24/51]
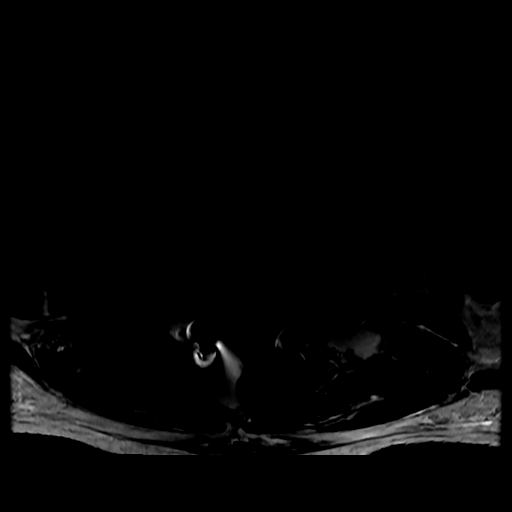
[im 27/51]
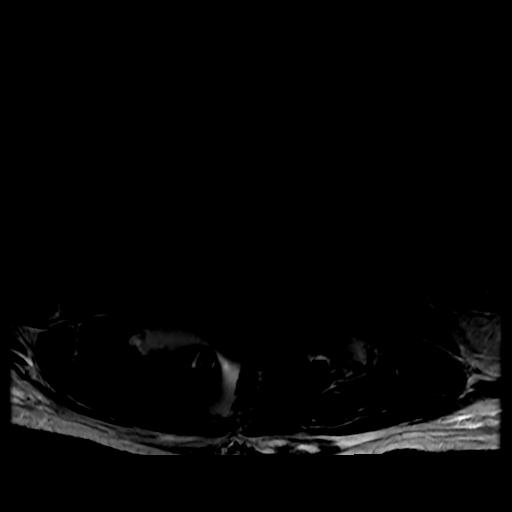
[im 44/51]
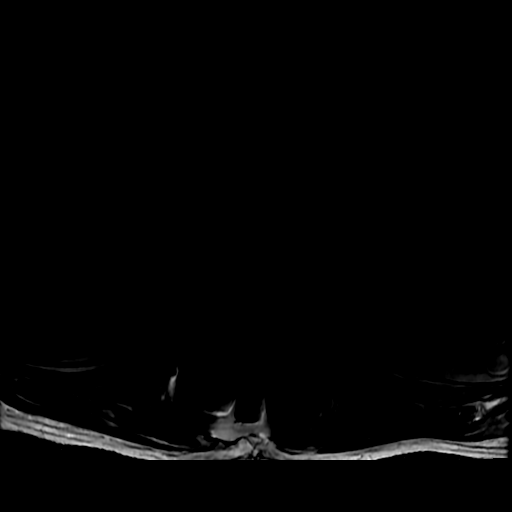

[Series 7: T1 · axial · 4.0mm · 0.39mm/px · z∈[-83,+101]mm · 3 of 51 slices shown (2 of 2)]
[im 7/51]
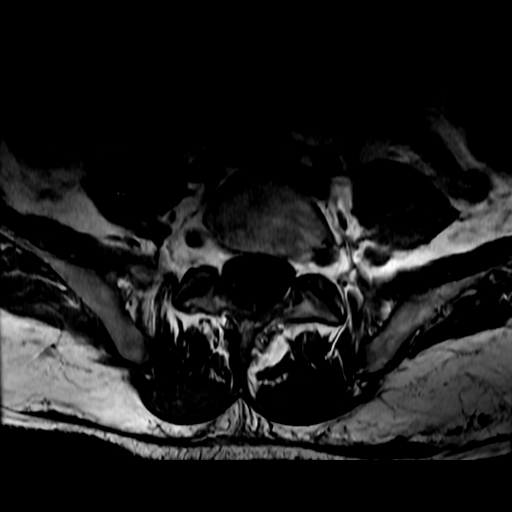
[im 27/51]
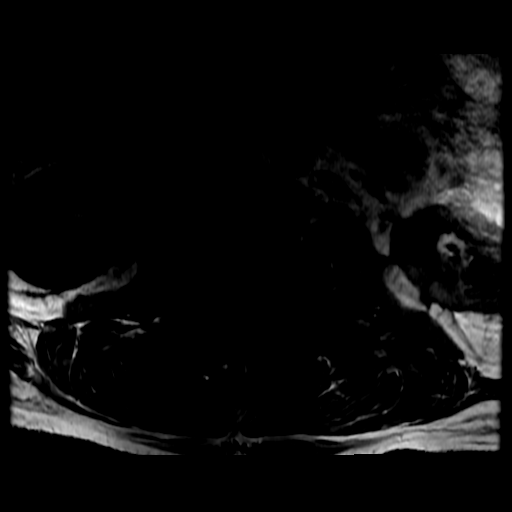
[im 44/51]
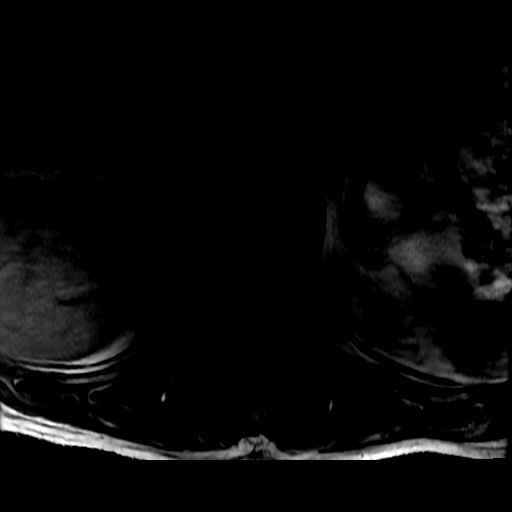

[18 of 48 positions shown; findings below may reference images not displayed]

FINDINGS: Segmentation:  Standard.

Alignment:  Mild levocurvature of the lumbar spine.  No listhesis.

Vertebrae: Status post interval posterior fixation T11-L4.
Susceptibility artifact from the hardware significantly limits
evaluation at these levels. Redemonstrated compression fracture of
L2, which appears unchanged from the prior exam. Redemonstrated
compression deformity at the superior aspect of L4, which also
appears unchanged. No new fracture.

Conus medullaris and cauda equina: The conus can not be visualized
because of susceptibility artifact. Below the level of L3-L4, the
cauda equina can be visualized and there appears to be nerve root
clumping (series 6, image 44), concerning for arachnoiditis. There
is a fluid fluid level in the distal thecal sac in the sacrum,
likely hemorrhage, presumed to be postoperative (series 6, image
48). Air is also noted within the thecal sac at the level of L5
(series 6, image 37).

Paraspinal and other soft tissues: Postsurgical changes posterior to
T11-L4, with a fluid collection surrounding the posterior elements
and hardware, extending from the superior edge of the field-of-view
to the level of L4, measuring up to 4.6 x 9.0 x 14.4 cm. The
anterior extent of the fluid collection is difficult to discern
given the susceptibility artifact. This may extend to contact the
thecal sac but this is unclear.

Disc levels:

Unable to evaluate T11-L3.

L3-L4: No spinal canal stenosis or neural foraminal narrowing.

L4-L5: No significant disc bulge. No osseous spinal canal stenosis
or neural foraminal narrowing.

L5-S1: No significant disc bulge. No osseous spinal canal stenosis.
No neural foraminal narrowing
IMPRESSION: 1. Extensive interval postsurgical changes, with posterior fixation
T11-L4. Susceptibility artifact from the hardware significantly
limits evaluation at these levels. Within this limitation, there is
an extensive fluid collection surrounding the hardware, which
extends from the superior edge of the field-of-view (approximally
T10-T11) through the level of L4, presumed to be postsurgical,
although an infected collection cannot be excluded.
2. Clumping of the nerve roots inferior to L3-L4 concerning for
arachnoiditis. A fluid fluid level in the distal thecal sac suggest
layering hemorrhage. Air is also noted within the thecal sac at the
level of L5.
3. The canal and foramina cannot be evaluated from T11 through L3.
No spinal canal stenosis or neural foraminal narrowing below the
level of L3-L4.

These results were called by telephone at the time of interpretation
on [DATE] at [DATE] to provider MATTIS , who verbally
acknowledged these results.

## 2021-12-07 IMAGING — CT CT T SPINE W/O CM
3 of 4 series · 10 of 33 positions shown, 12 images · non-contrast
Comparison: Comparison made with MRI performed earlier the same day
as well as multiple recent examinations.

CLINICAL DATA: Initial evaluation for increased back pain status
post recent trauma and spinal surgery.

EXAM:
CT THORACIC AND LUMBAR SPINE WITHOUT CONTRAST
TECHNIQUE: Multidetector CT imaging of the thoracic and lumbar spine was
performed without contrast. Multiplanar CT image reconstructions
were also generated.
RADIATION DOSE REDUCTION: This exam was performed according to the
departmental dose-optimization program which includes automated
exposure control, adjustment of the mA and/or kV according to
patient size and/or use of iterative reconstruction technique.

[Series 7: t spine soft · axial · 0.39mm/px · z∈[+457,+515]mm · 2 of 172 slices shown]
[im 29/172  soft-tissue]
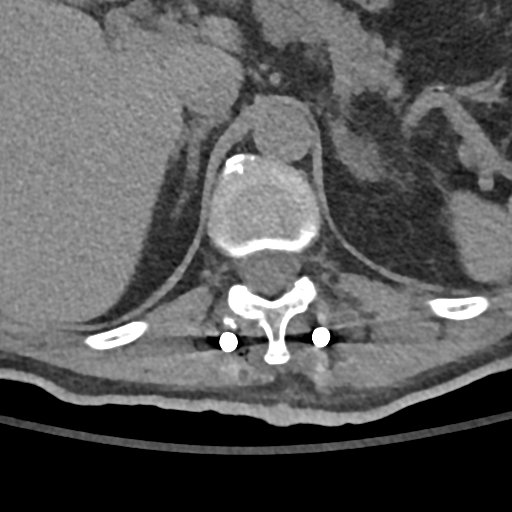
[im 58/172  soft-tissue]
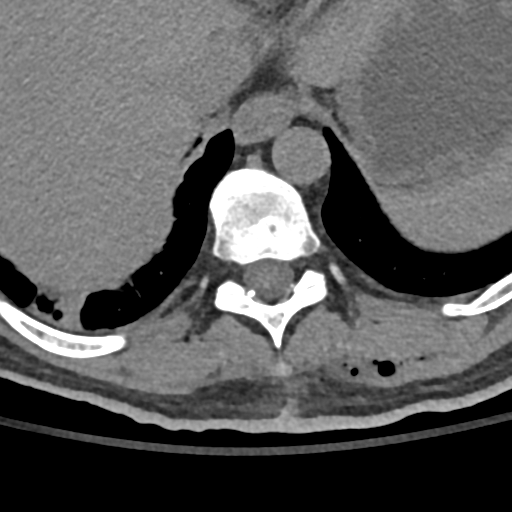

[Series 9: cor bone · coronal · 0.28mm/px · 3 of 85 slices shown]
[im 17/85  bone]
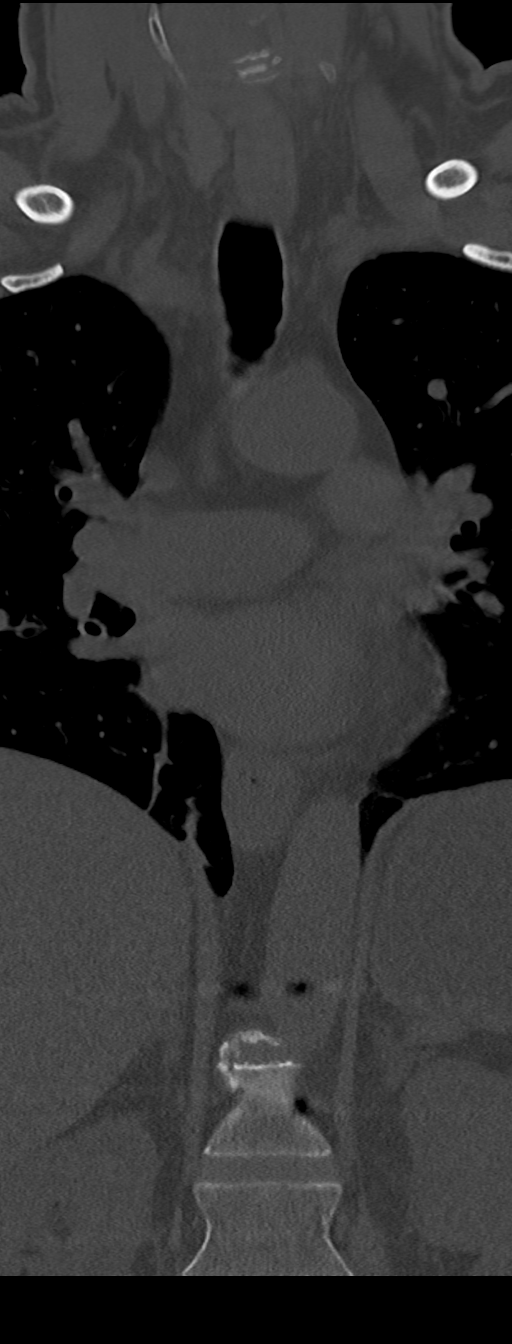
[im 34/85  bone]
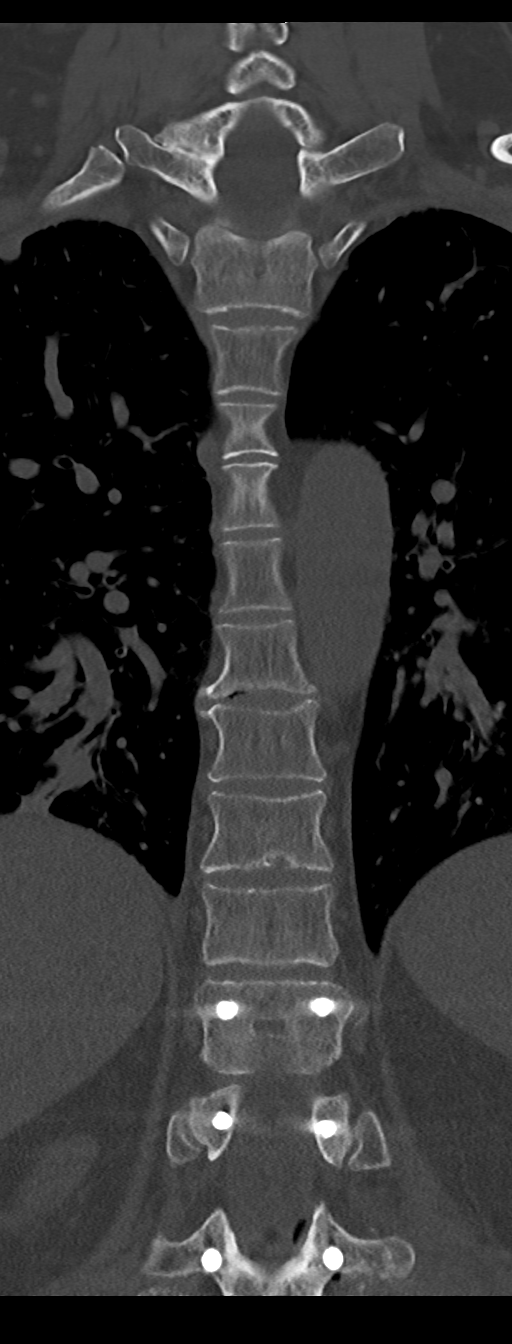
[im 51/85  bone]
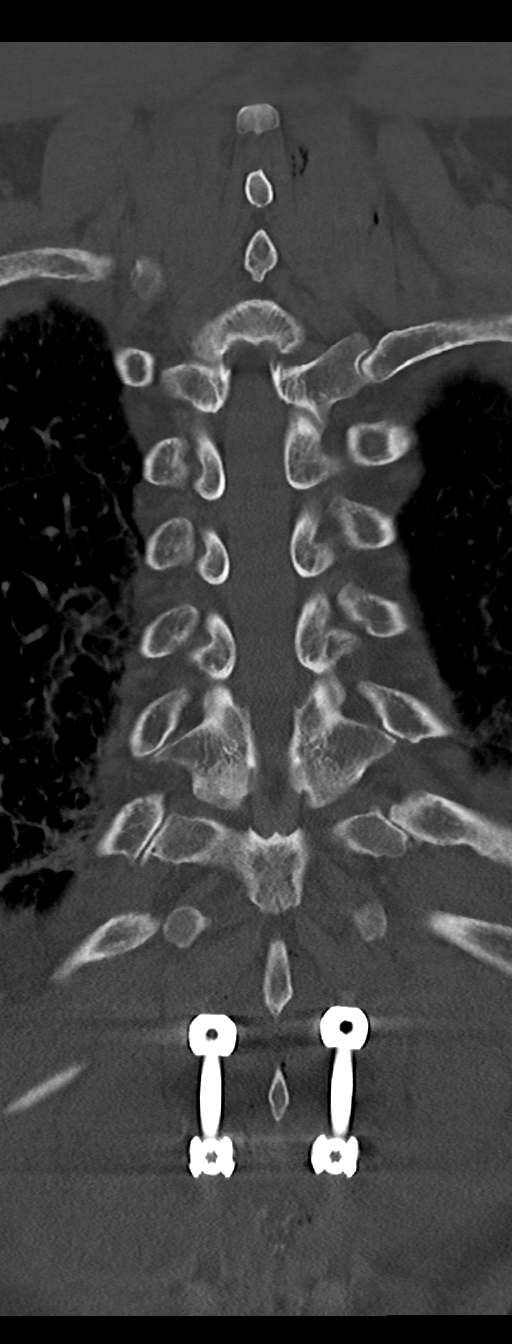

[Series 10: orthogonal bone · axial · 0.21mm/px · z∈[+462,+675]mm · 5 of 182 slices shown, 7 images]
[im 31/182  soft-tissue]
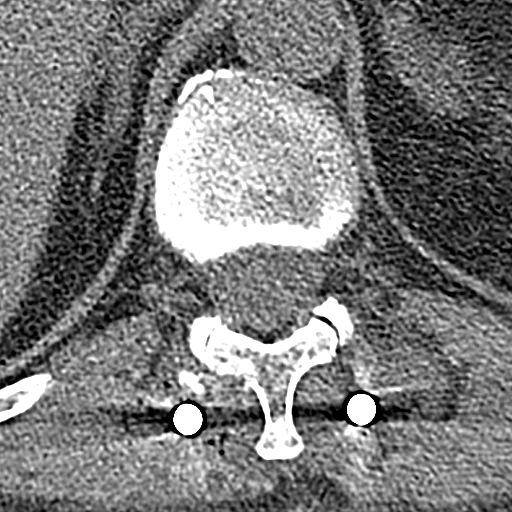
[im 31/182  bone]
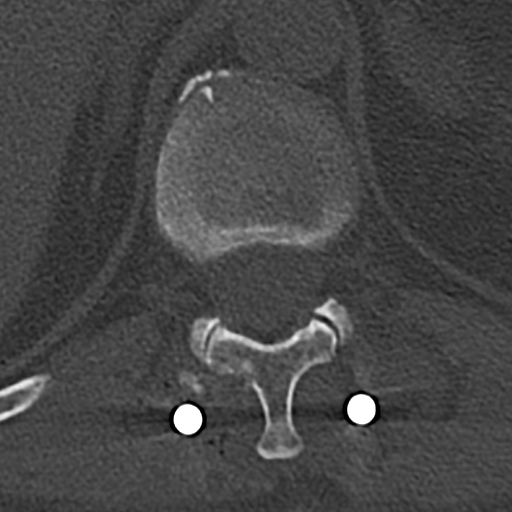
[im 61/182  bone]
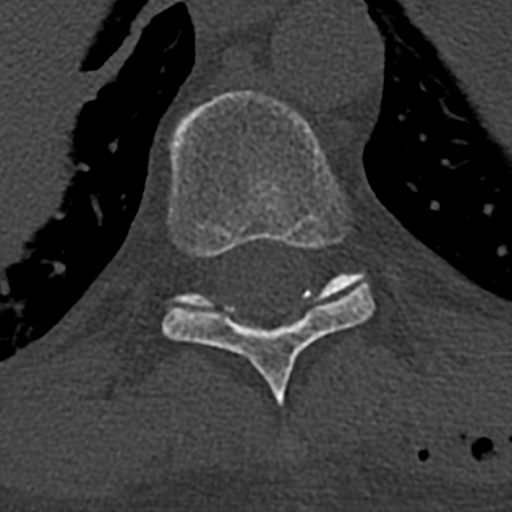
[im 91/182  bone]
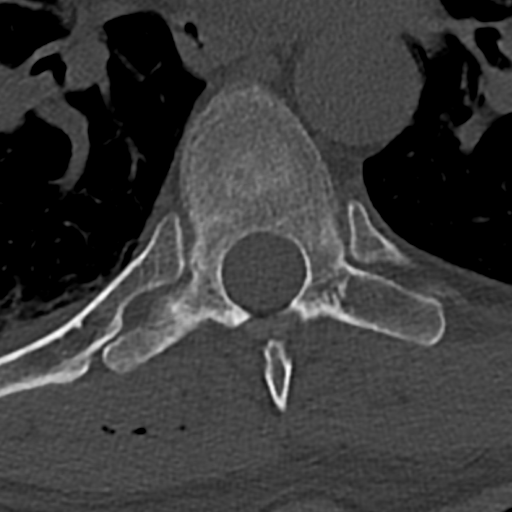
[im 121/182  bone]
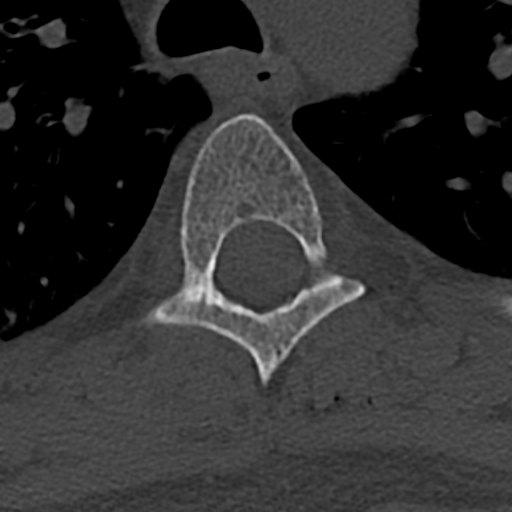
[im 151/182  soft-tissue]
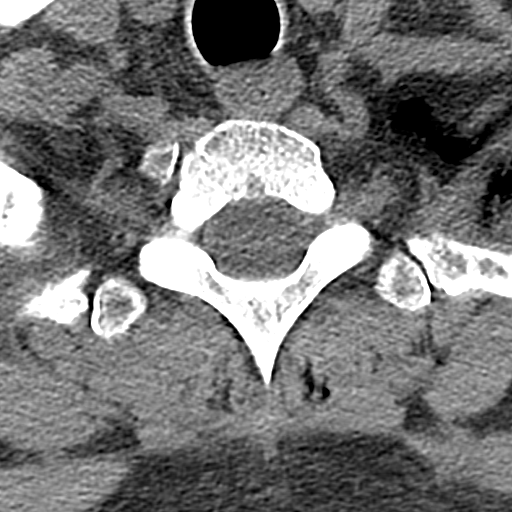
[im 151/182  bone]
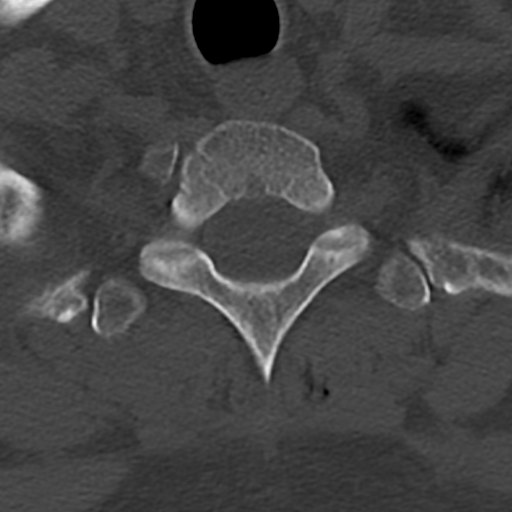

[10 of 33 positions shown; findings below may reference images not displayed]

FINDINGS: CT THORACIC SPINE FINDINGS

Alignment: Physiologic with preservation of the normal thoracic
kyphosis. No interval listhesis.

Vertebrae: Acute T12 vertebral body fracture without significant
height loss, grossly stable from previous. Vertebral body height
maintained with no other new or acute fracture. Posterior fusion
with bilateral transpedicular screws in place at T11 extending into
the upper lumbar spine. Visualized portions of the hardware are
intact. No periprosthetic lucency to suggest loosening. No discrete
or worrisome osseous lesions.

Paraspinal and other soft tissues: Scattered postoperative emphysema
present within the posterior paraspinous soft tissues. Dependent
atelectatic changes present within the right greater than left
lungs. 1.7 cm hyperdense lesion present at the interpolar left
kidney, nonspecific, but could reflect a proteinaceous and/or
hemorrhagic cyst (series 7, image 163).

Disc levels: No new or significant disc pathology seen within the
thoracic spine. No significant stenosis.

CT LUMBAR SPINE FINDINGS

Segmentation: Standard.

Alignment: Physiologic with preservation of the normal lumbar
lordosis. No interval listhesis or malalignment.

Vertebrae: Previously identified L2 burst type compression fracture
again seen, stable in appearance with stable height loss and bony
retropulsion. There has been interval fracturing of the left
transverse process of L2 (series 5, image 38). Acute compression
fracture involving L4 also relatively stable with stable height loss
without bony retropulsion.

Postoperative changes from interval posterior fusion at T11 through
L4, with bilateral transpedicular screws in place at T11 through L1,
and L3 and L4. Hardware is intact. The transpedicular screws at L3
abut the superior endplate of L3 without frank violation of the L2-3
interspace. No significant periprosthetic lucency to suggest
loosening.

Paraspinal and other soft tissues: Postoperative changes noted
within the posterior paraspinous soft tissues. Postoperative
collection better characterized on prior MRI. Superimposed allograft
material noted. Small amount of residual gas present within the
ventral thecal sac at the level of L4. Aortic atherosclerosis. Large
volume retained stool noted impacted within the rectal vault,
suggesting constipation.

Disc levels: Underlying mild degenerative spondylosis without
significant spinal stenosis, better characterized on recent MRI.
IMPRESSION: 1. Stable appearance of the acute L2 burst type compression fracture
as well as the additional compression fractures at T12 and L4.
Interval fracturing of the left transverse process of L2.
2. Postoperative changes from recent posterior fusion at T11 through
L4. Hardware intact without complication.
3. Postoperative changes throughout the lower thoracolumbar spine
with probable benign postoperative collection. Findings are better
characterized on recent MRI.
4. Large volume retained stool impacted within the rectal vault,
suggesting constipation.
5. 1.7 cm hyperdense lesion within the interpolar left kidney,
nonspecific. Follow-up examination with nonemergent renal mass
protocol CT and/or MRI suggested for further characterization.
6. Aortic Atherosclerosis ([RQ]-[RQ]).

## 2021-12-07 MED ORDER — SUMATRIPTAN SUCCINATE 50 MG PO TABS: 50.0000 mg | ORAL_TABLET | ORAL | Status: DC | PRN

## 2021-12-07 MED ORDER — ACETAMINOPHEN 325 MG PO TABS
650.0000 mg | ORAL_TABLET | Freq: Four times a day (QID) | ORAL | Status: DC | PRN
Start: 2021-12-07 — End: 2021-12-08

## 2021-12-07 MED ORDER — SENNOSIDES-DOCUSATE SODIUM 8.6-50 MG PO TABS: 1.0000 | ORAL_TABLET | Freq: Every evening | ORAL | Status: DC | PRN

## 2021-12-07 MED ORDER — HYDROMORPHONE HCL 1 MG/ML IJ SOLN
1.0000 mg | Freq: Once | INTRAMUSCULAR | Status: AC
  Administered 2021-12-07: 1 mg via INTRAVENOUS
  Filled 2021-12-07: qty 1

## 2021-12-07 MED ORDER — OXYCODONE HCL 5 MG PO TABS: 5.0000 mg | ORAL_TABLET | ORAL | Status: DC | PRN

## 2021-12-07 MED ORDER — DEXAMETHASONE SODIUM PHOSPHATE 10 MG/ML IJ SOLN
10.0000 mg | Freq: Once | INTRAMUSCULAR | Status: AC
  Administered 2021-12-07: 10 mg via INTRAVENOUS
  Filled 2021-12-07: qty 1

## 2021-12-07 MED ORDER — INSULIN ASPART 100 UNIT/ML IJ SOLN
0.0000 [IU] | Freq: Three times a day (TID) | INTRAMUSCULAR | Status: DC
  Administered 2021-12-08 (×2): 5 [IU] via SUBCUTANEOUS
  Administered 2021-12-08: 8 [IU] via SUBCUTANEOUS
  Administered 2021-12-09 – 2021-12-10 (×4): 5 [IU] via SUBCUTANEOUS
  Administered 2021-12-10: 2 [IU] via SUBCUTANEOUS
  Administered 2021-12-10: 3 [IU] via SUBCUTANEOUS

## 2021-12-07 MED ORDER — HEPARIN SODIUM (PORCINE) 5000 UNIT/ML IJ SOLN
5000.0000 [IU] | Freq: Three times a day (TID) | INTRAMUSCULAR | Status: DC
  Administered 2021-12-08 – 2021-12-10 (×6): 5000 [IU] via SUBCUTANEOUS
  Filled 2021-12-07 (×6): qty 1

## 2021-12-07 MED ORDER — POLYVINYL ALCOHOL 1.4 % OP SOLN
1.0000 [drp] | Freq: Two times a day (BID) | OPHTHALMIC | Status: DC | PRN
  Filled 2021-12-07: qty 15

## 2021-12-07 MED ORDER — BISACODYL 5 MG PO TBEC: 5.0000 mg | DELAYED_RELEASE_TABLET | Freq: Every day | ORAL | Status: DC | PRN

## 2021-12-07 MED ORDER — ADULT MULTIVITAMIN W/MINERALS CH
1.0000 | ORAL_TABLET | Freq: Every day | ORAL | Status: DC
  Administered 2021-12-07 – 2021-12-10 (×4): 1 via ORAL
  Filled 2021-12-07 (×4): qty 1

## 2021-12-07 MED ORDER — OXYCODONE HCL ER 10 MG PO T12A
20.0000 mg | EXTENDED_RELEASE_TABLET | Freq: Two times a day (BID) | ORAL | Status: DC
  Administered 2021-12-07: 20 mg via ORAL
  Filled 2021-12-07: qty 2

## 2021-12-07 MED ORDER — FENTANYL CITRATE PF 50 MCG/ML IJ SOSY
12.5000 ug | PREFILLED_SYRINGE | INTRAMUSCULAR | Status: DC | PRN
  Administered 2021-12-08: 50 ug via INTRAVENOUS
  Filled 2021-12-07: qty 1

## 2021-12-07 MED ORDER — POTASSIUM CHLORIDE IN NACL 20-0.9 MEQ/L-% IV SOLN
INTRAVENOUS | Status: DC
  Filled 2021-12-07 (×5): qty 1000

## 2021-12-07 MED ORDER — FLEET ENEMA 7-19 GM/118ML RE ENEM: 1.0000 | ENEMA | Freq: Once | RECTAL | Status: DC | PRN

## 2021-12-07 MED ORDER — ACETAMINOPHEN 650 MG RE SUPP: 650.0000 mg | Freq: Four times a day (QID) | RECTAL | Status: DC | PRN

## 2021-12-07 MED ORDER — LORAZEPAM 2 MG/ML IJ SOLN
1.0000 mg | Freq: Once | INTRAMUSCULAR | Status: AC
  Administered 2021-12-07: 1 mg via INTRAVENOUS
  Filled 2021-12-07: qty 1

## 2021-12-07 MED ORDER — SENNA 8.6 MG PO TABS
1.0000 | ORAL_TABLET | Freq: Two times a day (BID) | ORAL | Status: DC
  Administered 2021-12-07 – 2021-12-15 (×15): 8.6 mg via ORAL
  Filled 2021-12-07 (×15): qty 1

## 2021-12-07 MED ORDER — DIAZEPAM 5 MG PO TABS
5.0000 mg | ORAL_TABLET | Freq: Four times a day (QID) | ORAL | Status: DC | PRN
  Administered 2021-12-08 – 2021-12-09 (×5): 5 mg via ORAL
  Filled 2021-12-07 (×5): qty 1

## 2021-12-07 NOTE — Progress Notes (Signed)
Patient ID: Cody Berger, male   DOB: [birth year], 56 y.o.   MRN: [id number] CT reviewed. All of the hardware in proper position. No canal cutouts or pedicle cut outs.

## 2021-12-07 NOTE — H&P (Signed)
BP (!) 149/81   Pulse (!) 103   Temp 98.9 F (37.2 C) (Oral)   Resp 19   SpO2 95%  Cody Berger was admitted and taken to the operating room on 5/31 due to and Unstable L2 burst fracture, T12 fracture, L4 superior endplate fracture, back pain with leg pain and leg weakness. He underwent 1.  Open reduction internal fixation of L2 burst fracture with left L2 Hemi facetectomy and foraminotomy with reduction of the fracture fragments and sublaminar decompression 2.  Segmental posterior fixation T11-L4 using Alphatec pedicle screws.  4. Intertransverse arthrodesis T11-L4 using morcellized autograft and allograft. He requested discharge on 6/1 stating he felt so much better. This morning he and his wife state his pain became much worse, and despite doubling his pain medication , this did not help. He is moving all extremities, he is voiding, but stated upon arrival to the ED that he was unable to void.  No Known Allergies Past Medical History:  Diagnosis Date   Diabetes mellitus without complication ([place])    Skin cancer    Past Surgical History:  Procedure Laterality Date   BACK SURGERY     DENTAL SURGERY  04/12/2017   14 teeth removed   DENTAL SURGERY  08/2017   removed remaining 9 teeth   IRRIGATION AND DEBRIDEMENT ABSCESS  04/09/2017   neck - necrotizing fasciitis   LAMINECTOMY WITH POSTERIOR LATERAL ARTHRODESIS LEVEL 4 N/A 12/03/2021   Procedure: Open reduction internal fixation lumbar two fracture, instrumented fusion Thoracic Eleven-Lumbar Four;  Surgeon: Eustace Moore, MD;  Location: [place];  Service: Neurosurgery;  Laterality: N/A;   Prior to Admission medications   Medication Sig Start Date End Date Taking? Authorizing Provider  Carboxymethylcellulose Sodium 0.25 % SOLN Place 1 drop into both eyes 2 (two) times daily as needed (dry eyes/irritation).    [provider]  clobetasol cream (TEMOVATE) 1.02 % Apply 1 application. topically 2 (two) times daily as needed (rash).     [provider]  diclofenac Sodium (VOLTAREN) 1 % GEL Apply 1 application. topically 3 (three) times daily as needed (pain).    [provider]  Emollient (CETAPHIL) cream Apply 1 application. topically 2 (two) times daily as needed (dry skin).    [provider]  hydrOXYzine (VISTARIL) 50 MG capsule Take 50 mg by mouth 2 (two) times daily as needed for anxiety.    [provider]  lidocaine (LIDODERM) 5 % Place 1 patch onto the skin 2 (two) times daily as needed (pain). Remove & Discard patch within 12 hours or as directed by MD    [provider]  methocarbamol (ROBAXIN) 500 MG tablet Take 1 tablet (500 mg total) by mouth every 6 (six) hours as needed for muscle spasms. 12/04/21   Meyran, Ocie Cornfield, NP  oxyCODONE-acetaminophen (PERCOCET) 5-325 MG tablet Take 1 tablet by mouth every 4 (four) hours as needed for severe pain. 12/04/21 12/04/22  Meyran, Ocie Cornfield, NP  rizatriptan (MAXALT) 5 MG tablet Take 5 mg by mouth See admin instructions. Take one tablet (5 mg) by mouth at onset of migraine headache, may repeat in 2 hours if needed.    [provider]  Semaglutide (OZEMPIC, 1 MG/DOSE, [place]) Inject 1 mg into the skin every Saturday.    [provider]   Social History   Socioeconomic History   Marital status: Married    Spouse name: Not on file   Number of children: Not on file   Years  of education: Not on file   Highest education level: Not on file  Occupational History   Not on file  Tobacco Use   Smoking status: Every Day    Packs/day: 1.00    Types: Cigarettes   Smokeless tobacco: Never  Vaping Use   Vaping Use: Some days  Substance and Sexual Activity   Alcohol use: Yes    Alcohol/week: 1.0 standard drink    Types: 1 Glasses of wine per week   Drug use: Never   Sexual activity: Not on file  Other Topics Concern   Not on file  Social History Narrative   Not on file   Social Determinants of Health    Financial Resource Strain: Not on file  Food Insecurity: Not on file  Transportation Needs: Not on file  Physical Activity: Not on file  Stress: Not on file  Social Connections: Not on file  Intimate Partner Violence: Not on file   No family history on file. Physical Exam Constitutional:      General: He is in acute distress.     Appearance: He is normal weight.  HENT:     Head: Normocephalic and atraumatic.     Right Ear: External ear normal.     Left Ear: External ear normal.     Nose: Nose normal.     Mouth/Throat:     Mouth: Mucous membranes are dry.     Pharynx: Oropharynx is clear.  Eyes:     Extraocular Movements: Extraocular movements intact.     Conjunctiva/sclera: Conjunctivae normal.     Pupils: Pupils are equal, round, and reactive to light.  Cardiovascular:     Rate and Rhythm: Regular rhythm. Tachycardia present.     Pulses: Normal pulses.  Pulmonary:     Effort: Pulmonary effort is normal.     Breath sounds: Normal breath sounds.  Abdominal:     General: Abdomen is flat.     Palpations: Abdomen is soft.  Musculoskeletal:     Cervical back: Normal range of motion.  Neurological:     General: No focal deficit present.     Mental Status: He is alert and oriented to person, place, and time.     Cranial Nerves: Cranial nerves 2-12 are intact. No cranial nerve deficit.     Sensory: Sensation is intact.     Motor: Motor function is intact.     Coordination: Coordination is intact.     Deep Tendon Reflexes: Reflexes normal. Babinski sign absent on the right side. Babinski sign absent on the left side.     Comments: Gait not assessed Weakness on exam, pain limited movement   MR LUMBAR SPINE WO CONTRAST  Result Date: 12/07/2021 CLINICAL DATA:  Low back pain, cauda equina syndrome suspected EXAM: MRI LUMBAR SPINE WITHOUT CONTRAST TECHNIQUE: Multiplanar, multisequence MR imaging of the lumbar spine was performed. No intravenous contrast was administered.  COMPARISON:  12/02/2021 FINDINGS: Segmentation:  Standard. Alignment:  Mild levocurvature of the lumbar spine.  No listhesis. Vertebrae: Status post interval posterior fixation T11-L4. Susceptibility artifact from the hardware significantly limits evaluation at these levels. Redemonstrated compression fracture of L2, which appears unchanged from the prior exam. Redemonstrated compression deformity at the superior aspect of L4, which also appears unchanged. No new fracture. Conus medullaris and cauda equina: The conus can not be visualized because of susceptibility artifact. 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 There is a fluid fluid level in the distal thecal sac in the sacrum, likely hemorrhage, presumed to be postoperative (series 6, image 48). Air is also noted within the thecal sac at the level of L5 (series 6, image 37). Paraspinal and other soft tissues: Postsurgical changes posterior to T11-L4, with a fluid collection surrounding the posterior elements and hardware, extending from the superior edge of the field-of-view to the level of L4, measuring up to 4.6 x 9.0 x 14.4 cm. The anterior extent of the fluid collection is difficult to discern given the susceptibility artifact. This may extend to contact the thecal sac but this is unclear. Disc levels: Unable to evaluate T11-L3. L3-L4: No spinal canal stenosis or neural foraminal narrowing. L4-L5: No significant disc bulge. No osseous spinal canal stenosis or neural foraminal narrowing. L5-S1: No significant disc bulge. No osseous spinal canal stenosis. No neural foraminal narrowing IMPRESSION: 1. Extensive interval postsurgical changes, with posterior fixation T11-L4. Susceptibility artifact from the hardware significantly limits evaluation at these levels. Within this limitation, there is an extensive fluid collection surrounding the  hardware, which extends from the superior edge of the field-of-view (approximally T10-T11) through the level of L4, presumed to be postsurgical, although an infected collection cannot be excluded. 2. 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 A fluid fluid level in the distal thecal sac suggest layering hemorrhage. Air is also noted within the thecal sac at the level of L5. 3. The canal and foramina cannot be evaluated from T11 through L3. No spinal canal stenosis or neural foraminal narrowing below the level of L3-L4. These results were called by telephone at the time of interpretation on 12/07/2021 at 7:12 pm to provider [facility] , who verbally acknowledged these results. Electronically Signed   By: Merilyn Baba M.D.   On: 12/07/2021 19:14   ASSESSMENT: Cody Berger is a 56 y.o. male Status post arthrodesis L2 fracture. Increase in pain. MRI strongly suggests blood in the thecal sac. Bone quality per surgeon's notes was quite poor. Will admit for pain control, will obtain a CT lumbar spine to assess hardware.

## 2021-12-07 NOTE — ED Notes (Signed)
Bladder scan resulted >495

## 2021-12-07 NOTE — ED Provider Triage Note (Signed)
Emergency Medicine Provider Triage Evaluation Note  Cody Berger , a 56 y.o. male  was evaluated in triage.  Pt complains of back pain.  Recently had an ORIF lumbar fracture. His pain got much worse today.  He has been taking his prescribed pain medication without relief.  He did report temps of 100.5 yesterday.  He was given 100 mcg of fentanyl with EMS without any change in his symptoms.  He denies any problems emptying his bladder.   Physical Exam  BP 129/86 (BP Location: Right Arm)   Pulse 90   Temp 98.6 F (37 C) (Oral)   Resp 18   SpO2 100%  Gen:   Awake, appears uncomfortable Resp:  Normal effort  MSK:   Pain when moving hips Other:  2+ DP/PT pulses bilaterally.  Sensation intact to light touch to bilateral lower extremities.  Medical Decision Making  Medically screening exam initiated at 2:56 PM.  Appropriate orders placed.  Cody Berger was informed that the remainder of the evaluation will be completed by another provider, this initial triage assessment does not replace that evaluation, and the importance of remaining in the ED until their evaluation is complete.  We will check labs,   Ollen Gross 12/07/21 1458

## 2021-12-07 NOTE — ED Triage Notes (Signed)
Pt to triage via [facility] from home.  Reports lower back pain.  Back surgery 5/31. Dressing with small amount of bloody discharge.  Taking oxycodone.  Called office this morning and was told to increase oxycodone which did not help pain.    EMS- 20g LAC Fentanyl 170mg given PTA  Pt placed in recliner on arrival.

## 2021-12-07 NOTE — ED Provider Notes (Signed)
[facility] EMERGENCY DEPARTMENT Provider Note   CSN: [id number] Arrival date & time: 12/07/21  1415     History  Chief Complaint  Patient presents with   Back Pain    Cody Berger is a 56 y.o. male.   Back Pain  Patient with history of diabetes and tobacco dependence presents today due to lower back pain.  Patient had an ORIF on 12/03/2021 by Dr. Ronnald Ramp with [facility] neurosurgery.  Discharged 4 days ago, pain has been progressively worsening since then.  States he had a fever of 100.42 days ago.  He has been taking the oxycodone and Tylenol without any relief, called [facility] earlier today and they advised increasing to 10 mg of oxycodone.  This has not improved his pain in any way.  States he last urinated earlier today but cannot use the bathroom to defecate since yesterday.  States she is unable to pass any urine at this time.  He feels numb to the lower extremities bilaterally, he is able to feel sensation when touched but states it feels numb or different than his baseline.  Home Medications Prior to Admission medications   Medication Sig Start Date End Date Taking? Authorizing Provider  methocarbamol (ROBAXIN) 500 MG tablet Take 1 tablet (500 mg total) by mouth every 6 (six) hours as needed for muscle spasms. 12/04/21  Yes Meyran, Ocie Cornfield, NP  omeprazole (PRILOSEC) 40 MG capsule Take 80 mg by mouth daily.   Yes [provider]  oxyCODONE-acetaminophen (PERCOCET) 5-325 MG tablet Take 1 tablet by mouth every 4 (four) hours as needed for severe pain. 12/04/21 12/04/22 Yes Meyran, Ocie Cornfield, NP  Carboxymethylcellulose Sodium 0.25 % SOLN Place 1 drop into both eyes 2 (two) times daily as needed (dry eyes/irritation).    [provider]  clobetasol cream (TEMOVATE) 4.09 % Apply 1 application. topically 2 (two) times daily as needed (rash).    [provider]  diclofenac Sodium (VOLTAREN) 1 % GEL Apply 1 application.  topically 3 (three) times daily as needed (pain).    [provider]  Emollient (CETAPHIL) cream Apply 1 application. topically 2 (two) times daily as needed (dry skin).    [provider]  hydrOXYzine (VISTARIL) 50 MG capsule Take 50 mg by mouth 2 (two) times daily as needed for anxiety.    [provider]  lidocaine (LIDODERM) 5 % Place 1 patch onto the skin 2 (two) times daily as needed (pain). Remove & Discard patch within 12 hours or as directed by MD    [provider]  rizatriptan (MAXALT) 5 MG tablet Take 5 mg by mouth See admin instructions. Take one tablet (5 mg) by mouth at onset of migraine headache, may repeat in 2 hours if needed.    [provider]  Semaglutide (OZEMPIC, 1 MG/DOSE, [place]) Inject 1 mg into the skin every Saturday.    [provider]      Allergies    Patient has no known allergies.    Review of Systems   Review of Systems  Musculoskeletal:  Positive for back pain.   Physical Exam Updated Vital Signs BP (!) 149/81   Pulse (!) 103   Temp 98.9 F (37.2 C) (Oral)   Resp 19   SpO2 95%  Physical Exam Vitals and nursing note reviewed. Exam conducted with a chaperone present.  Constitutional:      General: He is in acute distress.     Appearance: Normal appearance.  Comments: Patient moaning in pain and unable to sit still, writhing in bed  HENT:     Head: Normocephalic and atraumatic.  Eyes:     General: No scleral icterus.       Right eye: No discharge.        Left eye: No discharge.     Extraocular Movements: Extraocular movements intact.     Pupils: Pupils are equal, round, and reactive to light.  Cardiovascular:     Rate and Rhythm: Normal rate and regular rhythm.     Pulses: Normal pulses.     Heart sounds: Normal heart sounds. No murmur heard.   No friction rub. No gallop.  Pulmonary:     Effort: Pulmonary effort is normal. No respiratory distress.     Breath sounds: Normal breath sounds.   Abdominal:     General: Abdomen is flat. Bowel sounds are normal. There is no distension.     Palpations: Abdomen is soft.     Tenderness: There is no abdominal tenderness.  Musculoskeletal:        General: Tenderness present.  Skin:    General: Skin is warm and dry.     Coloration: Skin is not jaundiced.  Neurological:     Mental Status: He is alert. Mental status is at baseline.     Sensory: Sensory deficit present.     Coordination: Coordination normal.     Comments: Cranial nerves III through XII are grossly intact.  Grip strength is equal bilaterally, lower extremity strength equal bilaterally.  Sensation to light touch of the lower extremities is perceived but reportedly lasts that he typically feels.  Patellar reflexes intact bilaterally, Achilles tendon reflex intact bilaterally.    ED Results / Procedures / Treatments   Labs (all labs ordered are listed, but only abnormal results are displayed) Labs Reviewed  COMPREHENSIVE METABOLIC PANEL - Abnormal; Notable for the following components:      Result Value   Sodium 134 (*)    CO2 18 (*)    Glucose, Bld 219 (*)    Calcium 8.7 (*)    Total Protein 5.9 (*)    Albumin 2.6 (*)    All other components within normal limits  CBC WITH DIFFERENTIAL/PLATELET - Abnormal; Notable for the following components:   WBC 15.5 (*)    RBC 3.82 (*)    Hemoglobin 12.4 (*)    HCT 35.4 (*)    Neutro Abs 13.5 (*)    Abs Immature Granulocytes 0.12 (*)    All other components within normal limits  URINALYSIS, ROUTINE W REFLEX MICROSCOPIC - Abnormal; Notable for the following components:   Glucose, UA >=500 (*)    Ketones, ur 80 (*)    Protein, ur 30 (*)    All other components within normal limits  CBG MONITORING, ED - Abnormal; Notable for the following components:   Glucose-Capillary 252 (*)    All other components within normal limits  LACTIC ACID, PLASMA  CBC  CREATININE, SERUM  CBC WITH DIFFERENTIAL/PLATELET  APTT  PROTIME-INR     EKG None  Radiology MR LUMBAR SPINE WO CONTRAST  Result Date: 12/07/2021 CLINICAL DATA:  Low back pain, cauda equina syndrome suspected EXAM: MRI LUMBAR SPINE WITHOUT CONTRAST TECHNIQUE: Multiplanar, multisequence MR imaging of the lumbar spine was performed. No intravenous contrast was administered. COMPARISON:  12/02/2021 FINDINGS: Segmentation:  Standard. Alignment:  Mild levocurvature of the lumbar spine.  No listhesis. Vertebrae: Status post interval posterior fixation T11-L4. Susceptibility artifact from the  hardware significantly limits evaluation at these levels. Redemonstrated compression fracture of L2, which appears unchanged from the prior exam. Redemonstrated compression deformity at the superior aspect of L4, which also appears unchanged. No new fracture. Conus medullaris and cauda equina: The conus can not be visualized because of susceptibility artifact. 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 There is a fluid fluid level in the distal thecal sac in the sacrum, likely hemorrhage, presumed to be postoperative (series 6, image 48). Air is also noted within the thecal sac at the level of L5 (series 6, image 37). Paraspinal and other soft tissues: Postsurgical changes posterior to T11-L4, with a fluid collection surrounding the posterior elements and hardware, extending from the superior edge of the field-of-view to the level of L4, measuring up to 4.6 x 9.0 x 14.4 cm. The anterior extent of the fluid collection is difficult to discern given the susceptibility artifact. This may extend to contact the thecal sac but this is unclear. Disc levels: Unable to evaluate T11-L3. L3-L4: No spinal canal stenosis or neural foraminal narrowing. L4-L5: No significant disc bulge. No osseous spinal canal stenosis or neural foraminal narrowing. L5-S1: No significant disc bulge. No osseous  spinal canal stenosis. No neural foraminal narrowing IMPRESSION: 1. Extensive interval postsurgical changes, with posterior fixation T11-L4. Susceptibility artifact from the hardware significantly limits evaluation at these levels. Within this limitation, there is an extensive fluid collection surrounding the hardware, which extends from the superior edge of the field-of-view (approximally T10-T11) through the level of L4, presumed to be postsurgical, although an infected collection cannot be excluded. 2. 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 A fluid fluid level in the distal thecal sac suggest layering hemorrhage. Air is also noted within the thecal sac at the level of L5. 3. The canal and foramina cannot be evaluated from T11 through L3. No spinal canal stenosis or neural foraminal narrowing below the level of L3-L4. These results were called by telephone at the time of interpretation on 12/07/2021 at 7:12 pm to provider [facility] , who verbally acknowledged these results. Electronically Signed   By: Merilyn Baba M.D.   On: 12/07/2021 19:14    Procedures Procedures    Medications Ordered in ED Medications  polyvinyl alcohol (LIQUIFILM TEARS) 1.4 % ophthalmic solution 1 drop (has no administration in time range)  insulin aspart (novoLOG) injection 0-15 Units (has no administration in time range)  heparin injection 5,000 Units (has no administration in time range)  0.9 % NaCl with KCl 20 mEq/ L  infusion (has no administration in time range)  acetaminophen (TYLENOL) tablet 650 mg (has no administration in time range)    Or  acetaminophen (TYLENOL) suppository 650 mg (has no administration in time range)  oxyCODONE (Oxy IR/ROXICODONE) immediate release tablet 5 mg (has no administration in time range)  fentaNYL (SUBLIMAZE) injection 12.5-50 mcg (has no administration in time range)  diazepam (VALIUM) tablet 5 mg (has no administration in time range)  oxyCODONE  (OXYCONTIN) 12 hr tablet 20 mg (has no administration in time range)  senna (SENOKOT) tablet 8.6 mg (has no administration in time range)  senna-docusate (Senokot-S) tablet 1 tablet (has no administration in time range)  bisacodyl (DULCOLAX) EC tablet 5 mg (has no administration in time range)  sodium phosphate (FLEET) 7-19 GM/118ML enema 1 enema (has no administration in time range)  multivitamin with minerals  tablet 1 tablet (has no administration in time range)  SUMAtriptan (IMITREX) tablet 50 mg (has no administration in time range)  HYDROmorphone (DILAUDID) injection 1 mg (1 mg Intravenous Given 12/07/21 1628)  LORazepam (ATIVAN) injection 1 mg (1 mg Intravenous Given 12/07/21 1714)  HYDROmorphone (DILAUDID) injection 1 mg (1 mg Intravenous Given 12/07/21 1716)  dexamethasone (DECADRON) injection 10 mg (10 mg Intravenous Given 12/07/21 1713)  HYDROmorphone (DILAUDID) injection 1 mg (1 mg Intravenous Given 12/07/21 2144)    ED Course/ Medical Decision Making/ A&P Clinical Course as of 12/07/21 2256  Sun Dec 07, 2021  1731 Amelia Jo is a 56 year old man with history of chronic hip pain and lower back pain, recently underwent lumbar spinal surgery 1 week ago with local neurosurgeon, presented ED with worsening of his back pain.  The patient reports that he feels back pain acutely worsened this morning, or perhaps for the past 2 days, he has chronic pain and was managing okay after the surgery but now cannot get up or walk, pain is not responding to oxycodone at home.  He reports the pain is both in his back and his bilateral hips near the iliac crest, which has been chronic, but now radiating down his legs although to his toes.  He reports diffuse paresthesias of the entire left and right leg, no anesthesia.  He says he has difficulty urinating, says he cannot find a position that is comfortable enough to relieve his bladder.  On exam the patient is extremely uncomfortable.  This is after being given Dilaudid  here as well as 100 g of fentanyl by EMS.  He does not have any objective weakness of the lower extremities on my exam.  He reports paresthesias bilaterally, reflexes are intact, does not have any gross anesthesia.  We will perform bladder scan and catheterize patient if unable to void, -I suspect this is more likely related to pain and positioning than cauda equina syndrome, however with this acute worsening of his symptoms, I have ordered an MRI of the lumbar spine.  Patient may require higher doses or novel treatment of pain, including consideration of ketamine, as it appears quite intractable. [MT]    Clinical Course User Index [MT] Trifan, Carola Rhine, MD                           Medical Decision Making Amount and/or Complexity of Data Reviewed Labs: ordered. Radiology: ordered.  Risk Prescription drug management. Decision regarding hospitalization.   This patient presents to the ED for concern of back pain, this involves an extensive number of treatment options, and is a complaint that carries with it a high risk of complications and morbidity.  The differential diagnosis includes acute pain from recent surgeries, cauda equina, abscess, sepsis.   Additional history obtained:   Independent historian: wife at bedside  IV external records, patient had ORIF 12/03/2021 by Dr. Ronnald Ramp with [facility] neurosurgery.    Lab Tests:  I ordered, viewed, and personally interpreted labs.  The pertinent results include: Patient is a leukocytosis with left shift.  Stable anemia with hemoglobin of 12.4 likely redo secondary to recent surgery.  There is no gross electrolyte derangement and patient is hyperglycemic with a glucose of 219 no underlying evidence of DKA.    Imaging Studies ordered:  I directly visualized the MR lumbar spine wo contrast, which showed MRI lumbar without contrast which has some findings that are concerning for possible arachnoiditis.  There  is air in the thecal sac at  L5, presents with fluid level and distal thecal sac which could be layering hemorrhage.   I agree with the radiologist interpretation    ECG/Cardiac monitoring:    The patient was maintained on a cardiac monitor.  Visualized monitor strip which showed NSR per my interpretation.    Medicines ordered and prescription drug management:  I ordered medication including: 10 mg decadron, '1mg'$  ativan, 2 mg dilaudid, 100 mg fentanyl by EMS  I have reviewed the patients home medicines and have made adjustments as needed    Consultations Obtained:  I requested consultation with the Dr. Christella Noa with neurosurgery.  Discussed lab and imaging findings as well as pertinent plan - they recommend: CT thoracolumbar and admission.    Reevaluation:  After the interventions noted above, I reevaluated the patient and found patient is still having significant amounts of pain.   Problems addressed / ED Course: Patient presents due to intractable lower back pain following a surgery.      Disposition:   After consideration of the diagnostic results and the patients response to treatment, I feel that the patent would benefit from admission.        Final Clinical Impression(s) / ED Diagnoses Final diagnoses:  Intractable low back pain    Rx / DC Orders ED Discharge Orders     None         Sherrill Raring, PA-C 12/07/21 2256    Wyvonnia Dusky, MD 12/08/21 1133

## 2021-12-08 LAB — CBC WITH DIFFERENTIAL/PLATELET
Abs Immature Granulocytes: 0.13 10*3/uL — ABNORMAL HIGH (ref 0.00–0.07)
Basophils Absolute: 0 10*3/uL (ref 0.0–0.1)
Basophils Relative: 0 %
Eosinophils Absolute: 0 10*3/uL (ref 0.0–0.5)
Eosinophils Relative: 0 %
HCT: 37.7 % — ABNORMAL LOW (ref 39.0–52.0)
Hemoglobin: 13.4 g/dL (ref 13.0–17.0)
Immature Granulocytes: 1 %
Lymphocytes Relative: 3 %
Lymphs Abs: 0.6 10*3/uL — ABNORMAL LOW (ref 0.7–4.0)
MCH: 32.7 pg (ref 26.0–34.0)
MCHC: 35.5 g/dL (ref 30.0–36.0)
MCV: 92 fL (ref 80.0–100.0)
Monocytes Absolute: 0.9 10*3/uL (ref 0.1–1.0)
Monocytes Relative: 5 %
Neutro Abs: 18.1 10*3/uL — ABNORMAL HIGH (ref 1.7–7.7)
Neutrophils Relative %: 91 %
Platelets: 401 10*3/uL — ABNORMAL HIGH (ref 150–400)
RBC: 4.1 MIL/uL — ABNORMAL LOW (ref 4.22–5.81)
RDW: 11.9 % (ref 11.5–15.5)
WBC: 19.6 10*3/uL — ABNORMAL HIGH (ref 4.0–10.5)
nRBC: 0 % (ref 0.0–0.2)

## 2021-12-08 LAB — APTT: aPTT: 30 seconds (ref 24–36)

## 2021-12-08 LAB — GLUCOSE, CAPILLARY
Glucose-Capillary: 228 mg/dL — ABNORMAL HIGH (ref 70–99)
Glucose-Capillary: 220 mg/dL — ABNORMAL HIGH (ref 70–99)
Glucose-Capillary: 181 mg/dL — ABNORMAL HIGH (ref 70–99)

## 2021-12-08 LAB — CREATININE, SERUM
Creatinine, Ser: 0.77 mg/dL (ref 0.61–1.24)
GFR, Estimated: >60 mL/min (ref >60)

## 2021-12-08 LAB — CBG MONITORING, ED: Glucose-Capillary: 256 mg/dL — ABNORMAL HIGH (ref 70–99)

## 2021-12-08 LAB — PROTIME-INR
INR: 1.1 (ref 0.8–1.2)
Prothrombin Time: 14 seconds (ref 11.4–15.2)

## 2021-12-08 MED ORDER — ACETAMINOPHEN 500 MG PO TABS
1000.0000 mg | ORAL_TABLET | Freq: Four times a day (QID) | ORAL | Status: DC
  Administered 2021-12-08 – 2021-12-09 (×4): 1000 mg via ORAL
  Filled 2021-12-08 (×4): qty 2

## 2021-12-08 MED ORDER — HYDROMORPHONE HCL 1 MG/ML IJ SOLN
0.5000 mg | INTRAMUSCULAR | Status: DC | PRN
  Administered 2021-12-08 – 2021-12-10 (×9): 0.5 mg via INTRAVENOUS
  Filled 2021-12-08 (×3): qty 0.5
  Filled 2021-12-08: qty 1
  Filled 2021-12-08 (×5): qty 0.5

## 2021-12-08 MED ORDER — KETOROLAC TROMETHAMINE 30 MG/ML IJ SOLN
30.0000 mg | Freq: Once | INTRAMUSCULAR | Status: AC
  Administered 2021-12-08: 30 mg via INTRAVENOUS
  Filled 2021-12-08: qty 1

## 2021-12-08 MED ORDER — GABAPENTIN 300 MG PO CAPS
300.0000 mg | ORAL_CAPSULE | Freq: Three times a day (TID) | ORAL | Status: DC
  Administered 2021-12-08 – 2021-12-15 (×20): 300 mg via ORAL
  Filled 2021-12-08 (×21): qty 1

## 2021-12-08 MED ORDER — ONDANSETRON HCL 4 MG/2ML IJ SOLN
4.0000 mg | Freq: Four times a day (QID) | INTRAMUSCULAR | Status: DC | PRN
  Administered 2021-12-08: 4 mg via INTRAVENOUS
  Filled 2021-12-08: qty 2

## 2021-12-08 MED ORDER — OXYCODONE HCL 5 MG PO TABS
10.0000 mg | ORAL_TABLET | ORAL | Status: DC | PRN
  Administered 2021-12-08 – 2021-12-10 (×7): 10 mg via ORAL
  Filled 2021-12-08 (×9): qty 2

## 2021-12-08 NOTE — Progress Notes (Signed)
He is feeling some better. Moving legs well. I have reviewed the imaging.  No indication for acute surgery. My only concern is that the intertransverse bone graft has washed out into the soft tissues. The fracture is stabilized and should heal, so this isn;t much different than if I'd simply done perc screws to stabilize the fracture I suspect.I have discussed with the pt.

## 2021-12-08 NOTE — ED Notes (Signed)
Pt moved to 38 at this time

## 2021-12-08 NOTE — TOC Initial Note (Addendum)
Transition of Care [facility]) - Initial/Assessment Note    Patient Details  Name: Cody Berger MRN: [id number] Date of Birth: 26-Jun-1966  [facility] of [facility]) CM/SW Contact:    Cody Sill, LCSW Phone Number: 12/08/2021, 1:22 PM  Clinical Narrative:                  CSW met with patient at bedside. CSW introduced self and explained role. Patient reports he lives home with spouse. CSW discussed short term rehab and SNF process through the [place] if SNF is recommended. TOC will be following for any recommendations. Patient states understanding.   Patient states his PCP located at  [facility] Jacelyn Berger).  CSW informed, TOC will continue to follow and assist with discharge planning once medically stable.   Expected Discharge Plan:  (TBD) Barriers to Discharge: Continued Medical Work up (PT/OT [place])   Patient Goals and CMS Choice        Expected Discharge Plan and Services Expected Discharge Plan:  (TBD) In-house Referral: Clinical Social Work     Living arrangements for the past 2 months: Single Family Home                                      Prior Living Arrangements/Services Living arrangements for the past 2 months: Single Family Home Lives with:: Self, Spouse Patient language and need for interpreter reviewed:: No        Need for Family Participation in Patient Care: Yes (Comment) Care giver support system in place?: Yes (comment)   Criminal Activity/Legal Involvement Pertinent to Current Situation/Hospitalization: No - Comment as needed  Activities of Daily Living      Permission Sought/Granted Permission sought to share information with : Family Supports Permission granted to share information with : Yes, Verbal Permission Granted  Share Information with NAME: Cody Berger  Permission granted to share info w AGENCY: SNFs  Permission granted to share info w Relationship: spouse  Permission granted to share info w Contact Information:  [phone]  Emotional Assessment Appearance:: Appears stated age Attitude/Demeanor/Rapport: Engaged Affect (typically observed): Appropriate Orientation: : Oriented to Self, Oriented to Place, Oriented to  Time, Oriented to Situation Alcohol / Substance Use: Not Applicable Psych Involvement: No (comment)  Admission diagnosis:  Intractable low back pain [M54.59] Burst fracture of lumbar vertebra (HCC) [S32.001A] Patient Active Problem List   Diagnosis Date Noted   Burst fracture of lumbar vertebra ([place]) 12/07/2021   S/P lumbar fusion 12/03/2021   Lumbar compression fracture ([place]) 12/02/2021   PCP:  Clinic, [facility]:   [facility] [place], [address] [address] Phone: [phone] Fax: [phone]  [place], [place] - [address] [place] [address] Phone: [phone] Fax: [phone]     Social Determinants of Health (SDOH) Interventions    Readmission Risk Interventions     View : No data to display.

## 2021-12-08 NOTE — ED Notes (Signed)
Received verbal report from Norman at this time

## 2021-12-08 NOTE — ED Notes (Signed)
Pt up to bedside commode with assistance. Per pt he needed to have a BM and didn't wish to use a bedpan

## 2021-12-09 ENCOUNTER — Inpatient Hospital Stay (HOSPITAL_COMMUNITY): Payer: No Typology Code available for payment source

## 2021-12-09 LAB — CBC
HCT: 33.6 % — ABNORMAL LOW (ref 39.0–52.0)
Hemoglobin: 11.3 g/dL — ABNORMAL LOW (ref 13.0–17.0)
MCH: 31.2 pg (ref 26.0–34.0)
MCHC: 33.6 g/dL (ref 30.0–36.0)
MCV: 92.8 fL (ref 80.0–100.0)
Platelets: 457 10*3/uL — ABNORMAL HIGH (ref 150–400)
RBC: 3.62 MIL/uL — ABNORMAL LOW (ref 4.22–5.81)
RDW: 12.2 % (ref 11.5–15.5)
WBC: 14 10*3/uL — ABNORMAL HIGH (ref 4.0–10.5)
nRBC: 0 % (ref 0.0–0.2)

## 2021-12-09 LAB — GLUCOSE, CAPILLARY
Glucose-Capillary: 250 mg/dL — ABNORMAL HIGH (ref 70–99)
Glucose-Capillary: 242 mg/dL — ABNORMAL HIGH (ref 70–99)
Glucose-Capillary: 206 mg/dL — ABNORMAL HIGH (ref 70–99)
Glucose-Capillary: 193 mg/dL — ABNORMAL HIGH (ref 70–99)

## 2021-12-09 MED ORDER — LIDOCAINE VISCOUS HCL 2 % MT SOLN
OROMUCOSAL | Status: AC
  Filled 2021-12-09: qty 15

## 2021-12-09 MED ORDER — SODIUM CHLORIDE 0.9 % IV BOLUS
500.0000 mL | Freq: Once | INTRAVENOUS | Status: AC
Start: 2021-12-09 — End: 2021-12-09
  Administered 2021-12-09: 500 mL via INTRAVENOUS

## 2021-12-09 MED ORDER — ACETAMINOPHEN 500 MG PO TABS
1000.0000 mg | ORAL_TABLET | Freq: Four times a day (QID) | ORAL | Status: DC
  Administered 2021-12-09 – 2021-12-10 (×4): 1000 mg via ORAL
  Filled 2021-12-09 (×5): qty 2

## 2021-12-09 MED ORDER — CHLORHEXIDINE GLUCONATE CLOTH 2 % EX PADS
6.0000 | MEDICATED_PAD | Freq: Every day | CUTANEOUS | Status: DC
  Administered 2021-12-09 – 2021-12-10 (×2): 6 via TOPICAL

## 2021-12-09 MED ORDER — KETOROLAC TROMETHAMINE 30 MG/ML IJ SOLN
30.0000 mg | Freq: Four times a day (QID) | INTRAMUSCULAR | Status: DC
  Administered 2021-12-09 – 2021-12-10 (×4): 30 mg via INTRAVENOUS
  Filled 2021-12-09 (×4): qty 1

## 2021-12-09 NOTE — Progress Notes (Signed)
Still complaining of severe back pain radiating into the lateral hips, moving legs well. Pain worse with mvmt. Will continue pain control. Try to mobilize.

## 2021-12-10 ENCOUNTER — Inpatient Hospital Stay (HOSPITAL_COMMUNITY): Payer: No Typology Code available for payment source | Admitting: Anesthesiology

## 2021-12-10 ENCOUNTER — Other Ambulatory Visit: Payer: Self-pay

## 2021-12-10 ENCOUNTER — Encounter (HOSPITAL_COMMUNITY)
Admission: EM | Disposition: A | Discharge status: Home Health | Payer: Self-pay | Source: Home / Self Care | Attending: Neurological Surgery

## 2021-12-10 ENCOUNTER — Inpatient Hospital Stay (HOSPITAL_COMMUNITY): Payer: No Typology Code available for payment source

## 2021-12-10 ENCOUNTER — Encounter (HOSPITAL_COMMUNITY): Payer: Self-pay | Admitting: Neurosurgery

## 2021-12-10 DIAGNOSIS — Z7984 Long term (current) use of oral hypoglycemic drugs: Secondary | ICD-10-CM

## 2021-12-10 DIAGNOSIS — G9600 Cerebrospinal fluid leak, unspecified: Secondary | ICD-10-CM

## 2021-12-10 DIAGNOSIS — E119 Type 2 diabetes mellitus without complications: Secondary | ICD-10-CM

## 2021-12-10 DIAGNOSIS — I10 Essential (primary) hypertension: Secondary | ICD-10-CM

## 2021-12-10 HISTORY — DX: Cerebrospinal fluid leak, unspecified: G96.00

## 2021-12-10 HISTORY — PX: LUMBAR LAMINECTOMY/DECOMPRESSION MICRODISCECTOMY: SHX5026

## 2021-12-10 LAB — GLUCOSE, CAPILLARY
Glucose-Capillary: 240 mg/dL — ABNORMAL HIGH (ref 70–99)
Glucose-Capillary: 181 mg/dL — ABNORMAL HIGH (ref 70–99)
Glucose-Capillary: 148 mg/dL — ABNORMAL HIGH (ref 70–99)
Glucose-Capillary: 142 mg/dL — ABNORMAL HIGH (ref 70–99)
Glucose-Capillary: 205 mg/dL — ABNORMAL HIGH (ref 70–99)

## 2021-12-10 IMAGING — CT CT L SPINE W/ CM
3 of 12 series · 10 of 33 positions shown, 11 images · non-contrast
Comparison: Multiple prior imaging studies since [DATE]

CLINICAL DATA: Previous lumbar fusion T10-L4 with decompression at
L2 for treatment of T12, L2 and L4 compression fractures.
Postoperative pain in the back and bilateral hips with weakness.
TECHNIQUE: Contiguous axial images were obtained through the Lumbar spine after
the intrathecal infusion of infusion. Coronal and sagittal
reconstructions were obtained of the axial image sets.

[Series 6: sag bone · sagittal · 0.31mm/px · 5 of 80 slices shown]
[im 14/80  bone]
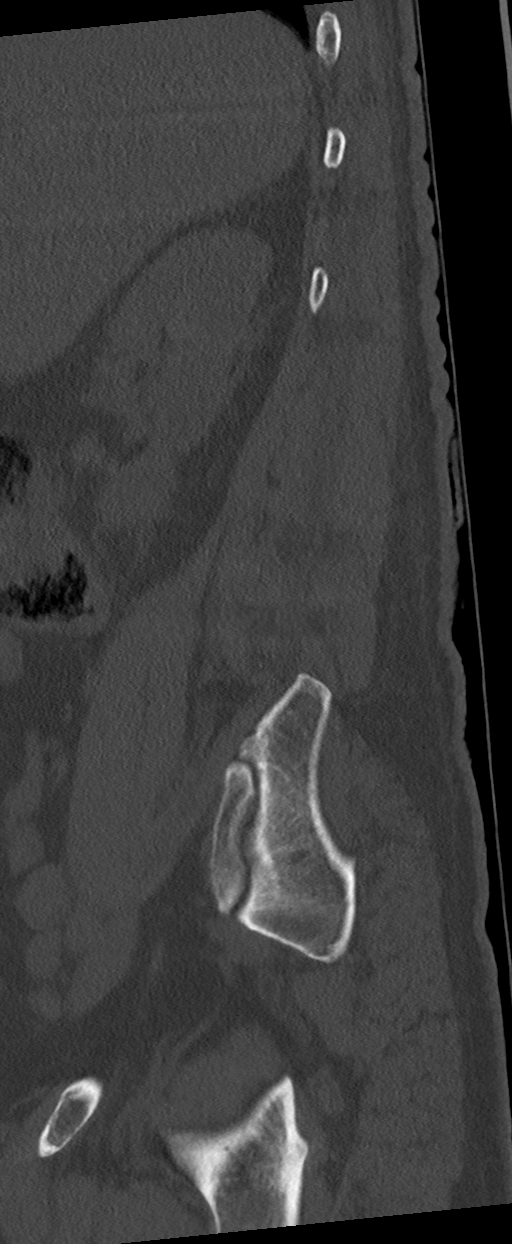
[im 27/80  bone]
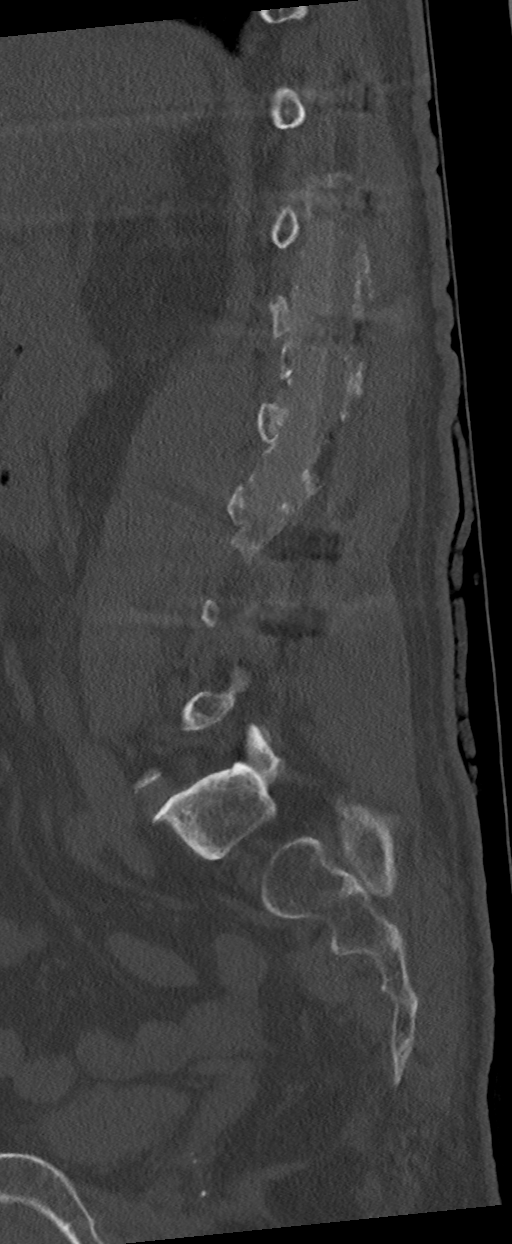
[im 40/80  bone]
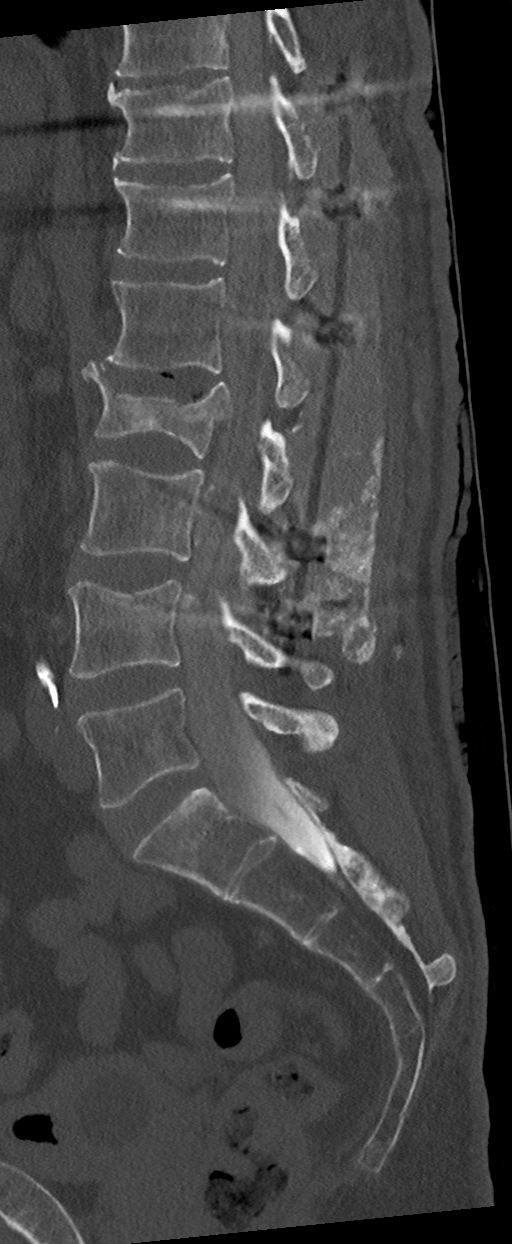
[im 53/80  bone]
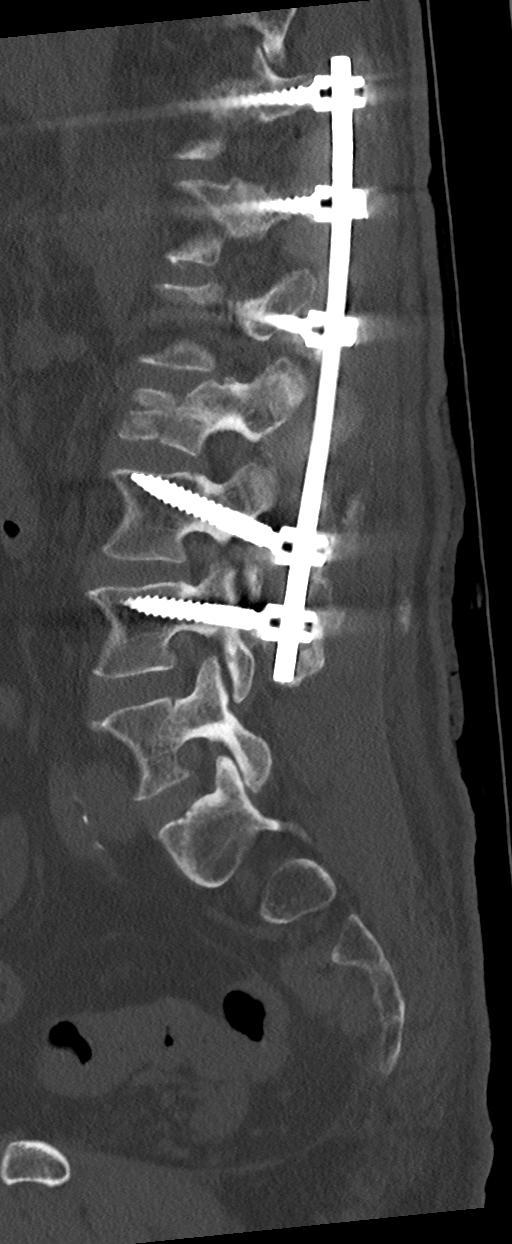
[im 66/80  bone]
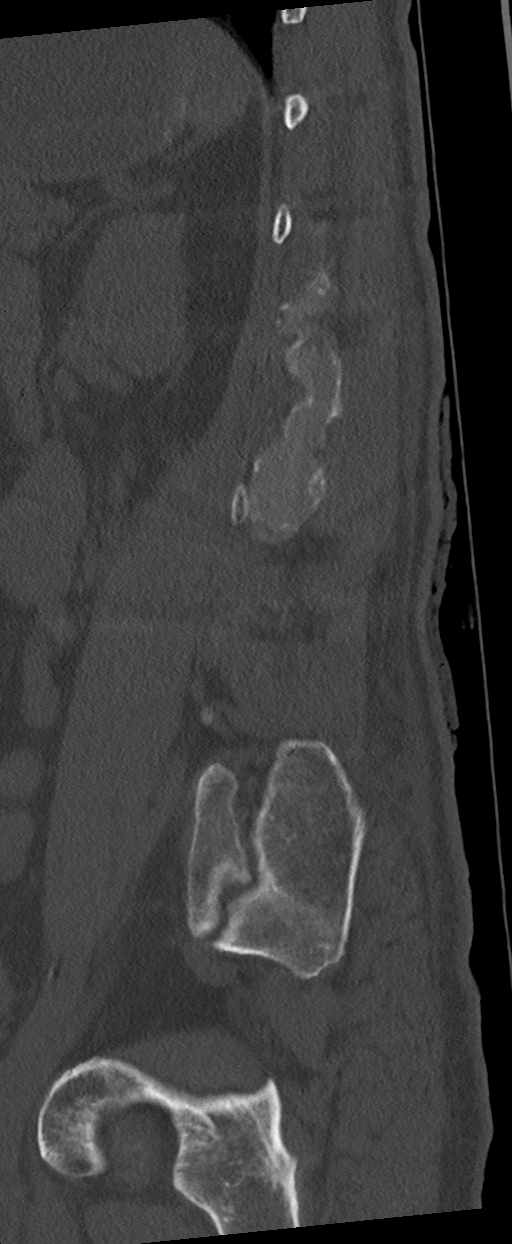

[Series 7: cor bone · coronal · 0.43mm/px · 1 of 86 slices shown]
[im 43/86  bone]
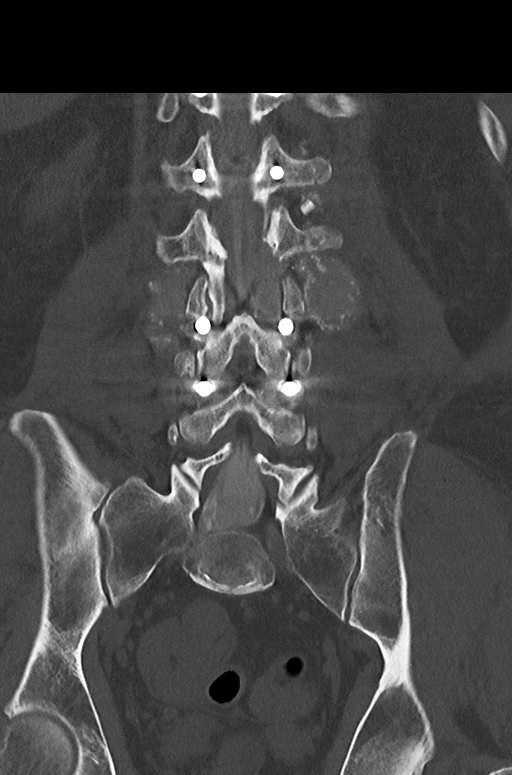

[Series 10: l spine soft · axial · 0.31mm/px · z∈[+1062,+1288]mm · 4 of 375 slices shown, 5 images]
[im 75/375  soft-tissue]
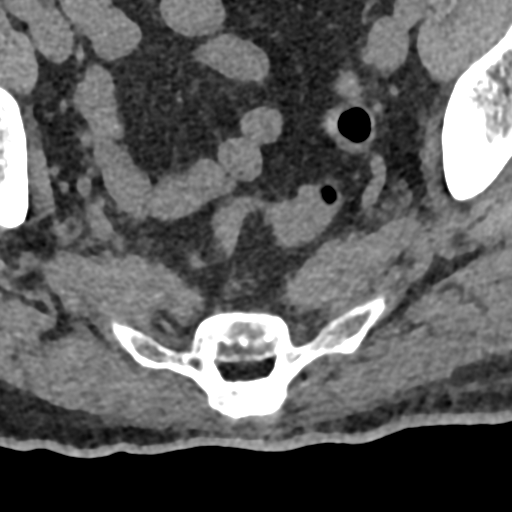
[im 75/375  bone]
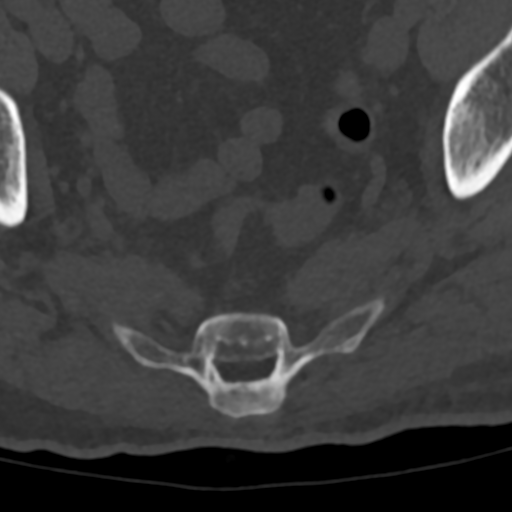
[im 150/375  bone]
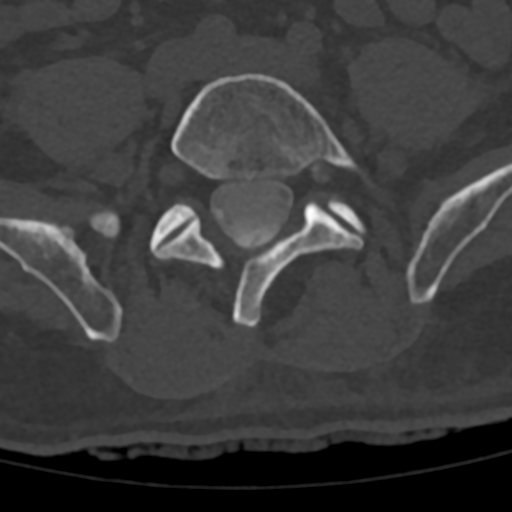
[im 225/375  bone]
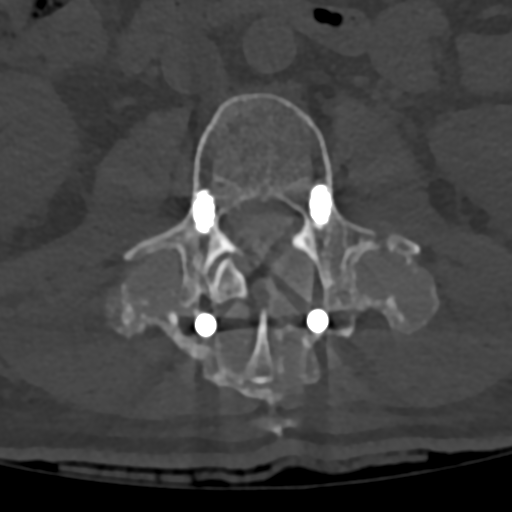
[im 300/375  bone]
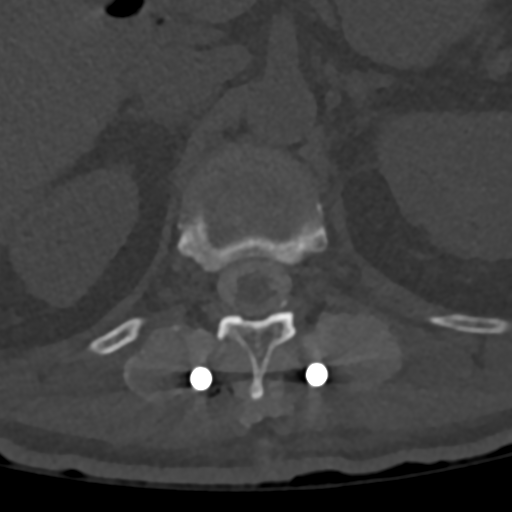

[10 of 33 positions shown; findings below may reference images not displayed]

EXAM:
LUMBAR MYELOGRAM

FLUOROSCOPY:
1 minute 18 seconds.  1083.85 micro gray meter squared

PROCEDURE:
After thorough discussion of risks and benefits of the procedure
including bleeding, infection, injury to nerves, blood vessels,
adjacent structures as well as headache and CSF leak, written and
oral informed consent was obtained. Consent was obtained by Dr. JATHOONIA
JATHOONIA. Time out form was completed.

Patient was positioned prone on the fluoroscopy table. Local
anesthesia was provided with 1% lidocaine without epinephrine after
prepped and draped in the usual sterile fashion. Puncture was
performed at L5-S1 using a 3 1/2 inch 22-gauge spinal needle via
right paramedian approach. Using a single pass through the dura, the
needle was placed within the thecal sac, with return of clear CSF.
20 cc of Omnipaque 180 was injected into the thecal sac, with normal
opacification of the nerve roots and cauda equina consistent with
free flow within the subarachnoid space.

I personally performed the lumbar puncture and administered the
intrathecal contrast. I also personally performed acquisition of the
myelogram images.
FINDINGS: LUMBAR MYELOGRAM FINDINGS:

No compressive stenosis of the canal. Small anterior extradural
defect at L2 due to retropulsed bone, but the surgical decompression
as a good appearance. No evidence of hardware complication. Large
CSF leak towards the left emanating from the L2-3 region.

CT LUMBAR MYELOGRAM FINDINGS:

Pedicle screws and posterior rods from T11 to L4, with exception of
the level of the burst compression fracture at L2. Pedicle screws
appear well positioned. No evidence of disconnection or hardware
complication.

Sufficient patency of the spinal canal throughout the region. At the
level of the L2 burst compression fracture, there is retropulsion of
5 mm, not increased. Good posterior decompression. There does not
appear to be any neural compression.

There is a CSF leak emanating from the left side at the level of
inferior L2 in the L2-3 disc. Contrast opacifies the fluid
collection previously shown throughout the operative region of
hardware placement from the T11-12 disc level down as far as the L4
hardware.

Minor superior endplate fractures at T12 and L4 appear unchanged and
unremarkable.
IMPRESSION: Spinal fusion from T11 through L4. Posterior decompression at L2. No
change in the appearance of the L2 burst fracture with retropulsion
of 5 mm.

Large CSF leak/dural defect on the left side at the level of
inferior L2 and the L2-3 disc space, with contrast completely
opacifying the fluid collection previously seen posteriorly along
the hardware from T11-L4.

## 2021-12-10 IMAGING — RF DG MYELOGRAPHY LUMBAR INJ LUMBOSACRAL
13 of 16 series · 13 of 16 positions shown · non-contrast
Comparison: Multiple prior imaging studies since [DATE]

CLINICAL DATA: Previous lumbar fusion T10-L4 with decompression at
L2 for treatment of T12, L2 and L4 compression fractures.
Postoperative pain in the back and bilateral hips with weakness.
TECHNIQUE: Contiguous axial images were obtained through the Lumbar spine after
the intrathecal infusion of infusion. Coronal and sagittal
reconstructions were obtained of the axial image sets.

[Series 1: cp_standard · 0.29mm/px · 1 of 1 slices shown (1 of 2)]
[im 1/1]
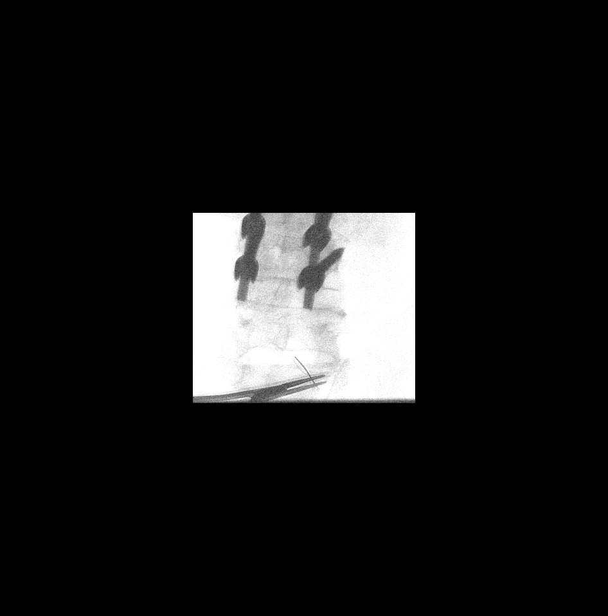

[Series 2: cp_standard · 0.29mm/px · 1 of 1 slices shown (2 of 2)]
[im 1/1]
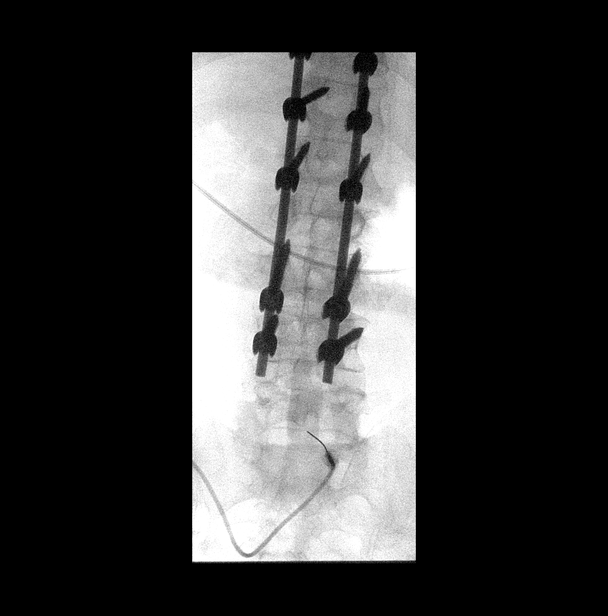

[Series 4: fluoro_myelogram_singleshot_bw · 0.19mm/px · 1 of 1 slices shown (1 of 11)]
[im 1/1]
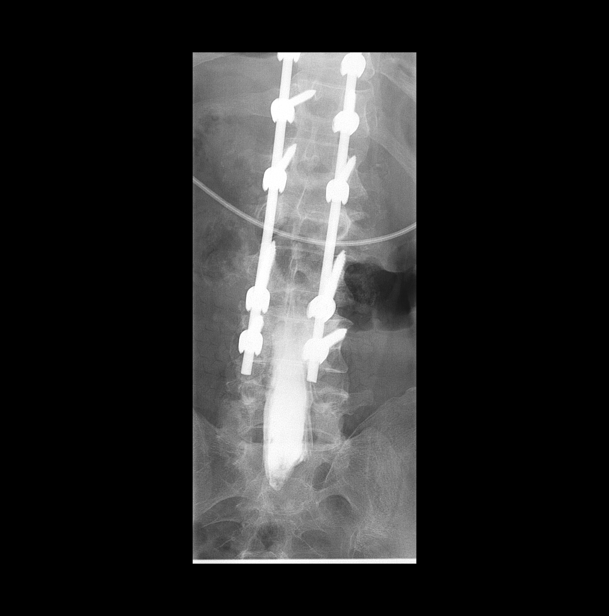

[Series 5: fluoro_myelogram_singleshot_bw · 0.20mm/px · 1 of 1 slices shown (2 of 11)]
[im 1/1]
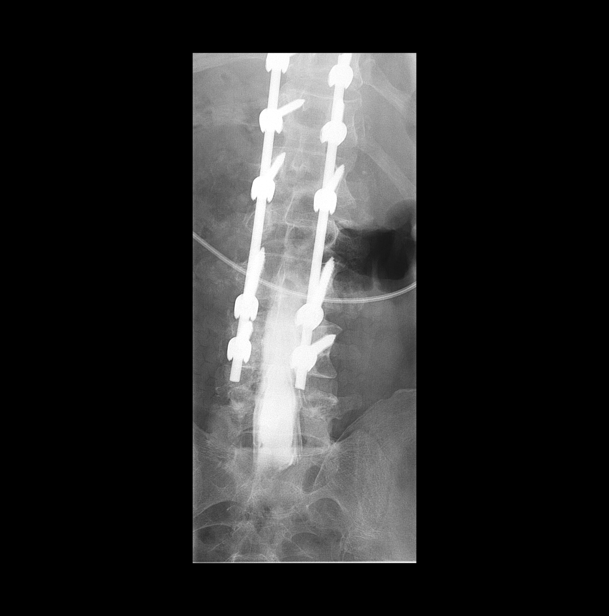

[Series 6: fluoro_myelogram_singleshot_bw · 0.20mm/px · 1 of 1 slices shown (3 of 11)]
[im 1/1]
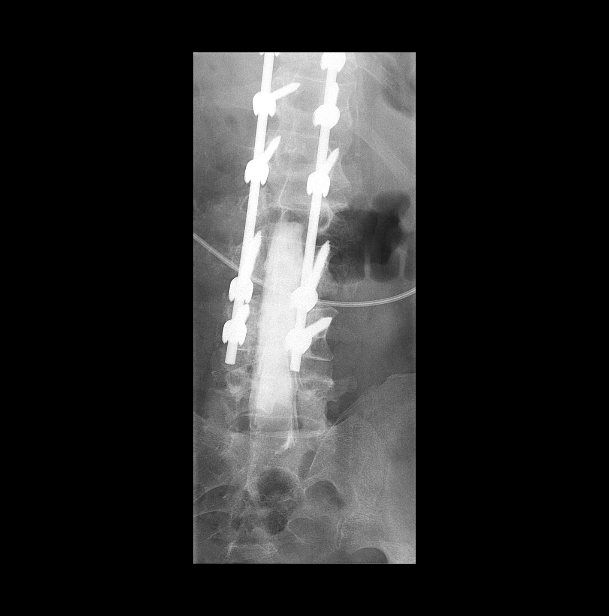

[Series 7: fluoro_myelogram_singleshot_bw · 0.20mm/px · 1 of 1 slices shown (4 of 11)]
[im 1/1]
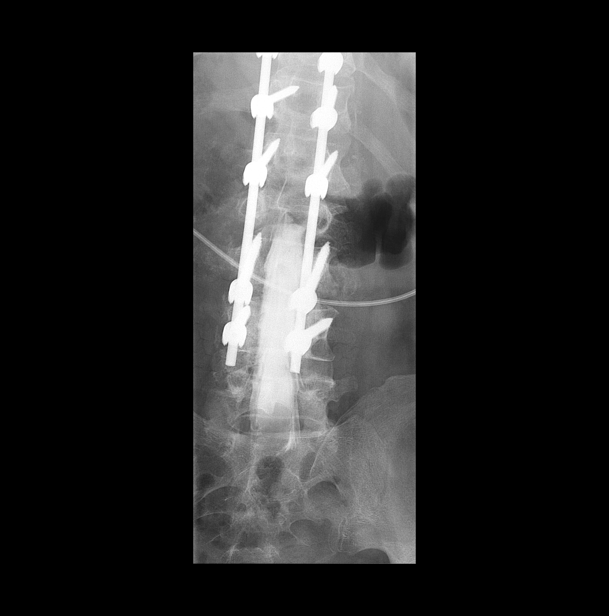

[Series 9: fluoro_myelogram_singleshot_bw · 0.20mm/px · 1 of 1 slices shown (5 of 11)]
[im 1/1]
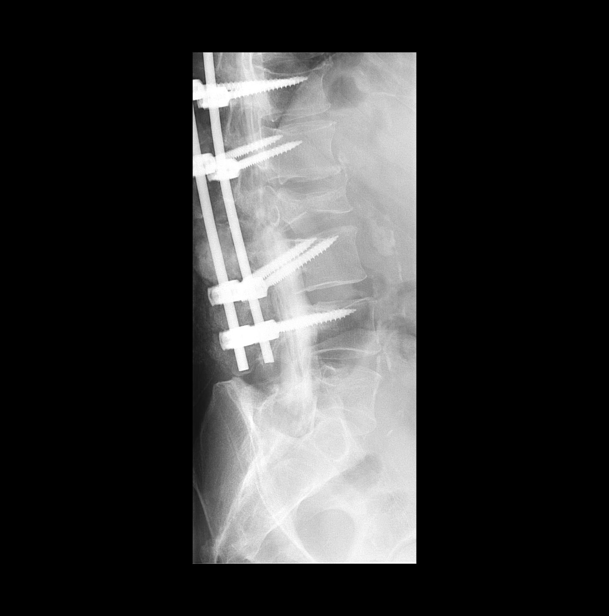

[Series 10: fluoro_myelogram_singleshot_bw · 0.19mm/px · 1 of 1 slices shown (6 of 11)]
[im 1/1]
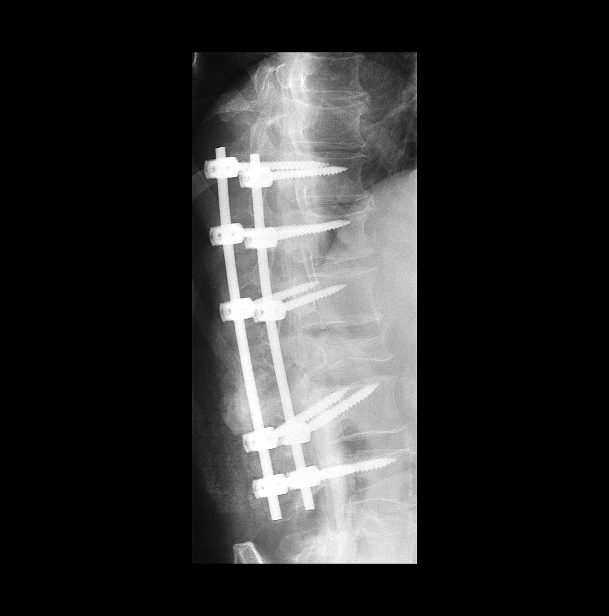

[Series 11: fluoro_myelogram_singleshot_bw · 0.20mm/px · 1 of 1 slices shown (7 of 11)]
[im 1/1]
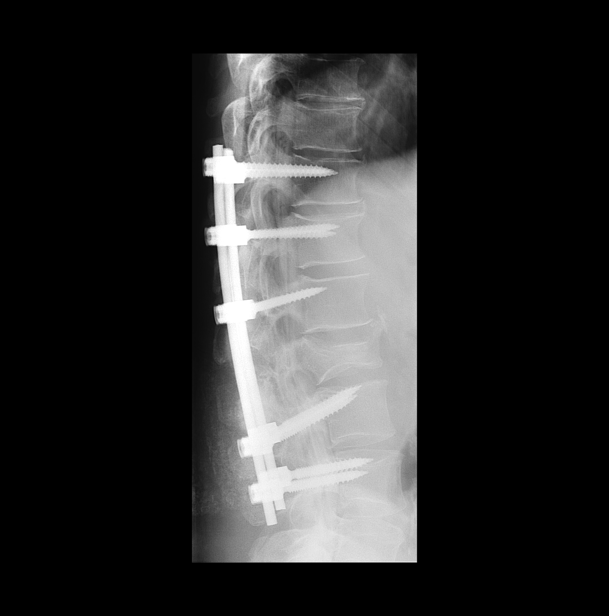

[Series 12: fluoro_myelogram_singleshot_bw · 0.20mm/px · 1 of 1 slices shown (8 of 11)]
[im 1/1]
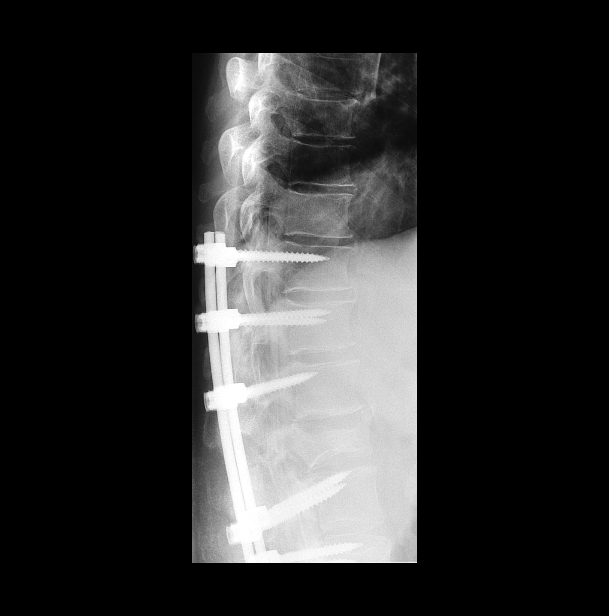

[Series 13: fluoro_myelogram_singleshot_bw · 0.19mm/px · 1 of 1 slices shown (9 of 11)]
[im 1/1]
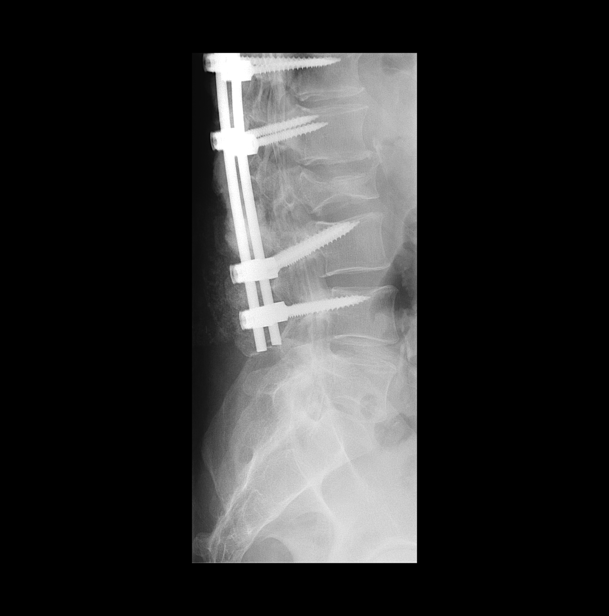

[Series 15: fluoro_myelogram_singleshot_bw · 0.21mm/px · 1 of 1 slices shown (10 of 11)]
[im 1/1]
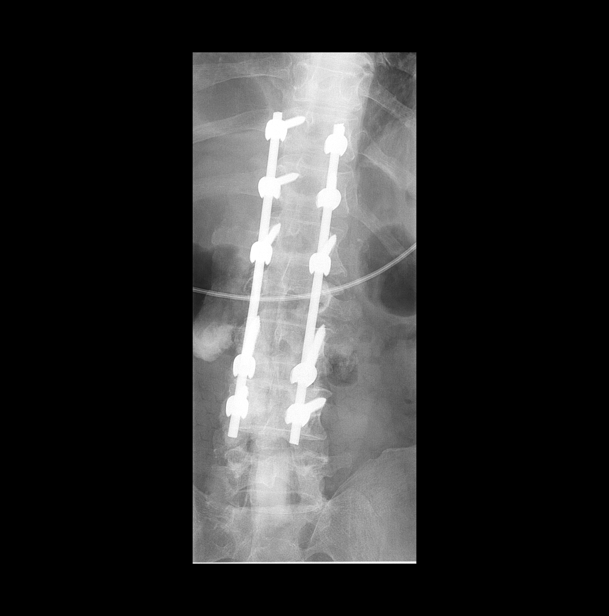

[Series 16: fluoro_myelogram_singleshot_bw · 0.21mm/px · 1 of 1 slices shown (11 of 11)]
[im 1/1]
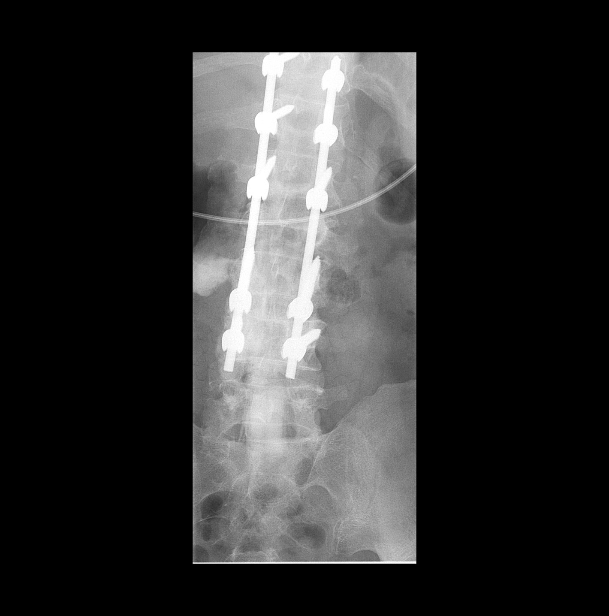

[13 of 16 positions shown; findings below may reference images not displayed]

EXAM:
LUMBAR MYELOGRAM

FLUOROSCOPY:
1 minute 18 seconds.  1083.85 micro gray meter squared

PROCEDURE:
After thorough discussion of risks and benefits of the procedure
including bleeding, infection, injury to nerves, blood vessels,
adjacent structures as well as headache and CSF leak, written and
oral informed consent was obtained. Consent was obtained by Dr. JATHOONIA
JATHOONIA. Time out form was completed.

Patient was positioned prone on the fluoroscopy table. Local
anesthesia was provided with 1% lidocaine without epinephrine after
prepped and draped in the usual sterile fashion. Puncture was
performed at L5-S1 using a 3 1/2 inch 22-gauge spinal needle via
right paramedian approach. Using a single pass through the dura, the
needle was placed within the thecal sac, with return of clear CSF.
20 cc of Omnipaque 180 was injected into the thecal sac, with normal
opacification of the nerve roots and cauda equina consistent with
free flow within the subarachnoid space.

I personally performed the lumbar puncture and administered the
intrathecal contrast. I also personally performed acquisition of the
myelogram images.
FINDINGS: LUMBAR MYELOGRAM FINDINGS:

No compressive stenosis of the canal. Small anterior extradural
defect at L2 due to retropulsed bone, but the surgical decompression
as a good appearance. No evidence of hardware complication. Large
CSF leak towards the left emanating from the L2-3 region.

CT LUMBAR MYELOGRAM FINDINGS:

Pedicle screws and posterior rods from T11 to L4, with exception of
the level of the burst compression fracture at L2. Pedicle screws
appear well positioned. No evidence of disconnection or hardware
complication.

Sufficient patency of the spinal canal throughout the region. At the
level of the L2 burst compression fracture, there is retropulsion of
5 mm, not increased. Good posterior decompression. There does not
appear to be any neural compression.

There is a CSF leak emanating from the left side at the level of
inferior L2 in the L2-3 disc. Contrast opacifies the fluid
collection previously shown throughout the operative region of
hardware placement from the T11-12 disc level down as far as the L4
hardware.

Minor superior endplate fractures at T12 and L4 appear unchanged and
unremarkable.
IMPRESSION: Spinal fusion from T11 through L4. Posterior decompression at L2. No
change in the appearance of the L2 burst fracture with retropulsion
of 5 mm.

Large CSF leak/dural defect on the left side at the level of
inferior L2 and the L2-3 disc space, with contrast completely
opacifying the fluid collection previously seen posteriorly along
the hardware from T11-L4.

## 2021-12-10 SURGERY — LUMBAR LAMINECTOMY/DECOMPRESSION MICRODISCECTOMY 1 LEVEL
Anesthesia: General | Site: Back

## 2021-12-10 MED ORDER — ONDANSETRON HCL 4 MG/2ML IJ SOLN
INTRAMUSCULAR | Status: AC
  Filled 2021-12-10: qty 8

## 2021-12-10 MED ORDER — CEFAZOLIN SODIUM-DEXTROSE 2-3 GM-%(50ML) IV SOLR
INTRAVENOUS | Status: DC | PRN
  Administered 2021-12-10: 2 g via INTRAVENOUS

## 2021-12-10 MED ORDER — THROMBIN 5000 UNITS EX SOLR
CUTANEOUS | Status: AC
  Filled 2021-12-10: qty 5000

## 2021-12-10 MED ORDER — ONDANSETRON HCL 4 MG/2ML IJ SOLN
INTRAMUSCULAR | Status: AC
  Filled 2021-12-10: qty 2

## 2021-12-10 MED ORDER — METHOCARBAMOL 1000 MG/10ML IJ SOLN: 500.0000 mg | Freq: Four times a day (QID) | INTRAVENOUS | Status: DC | PRN

## 2021-12-10 MED ORDER — INSULIN ASPART 100 UNIT/ML IJ SOLN
0.0000 [IU] | Freq: Three times a day (TID) | INTRAMUSCULAR | Status: DC
  Administered 2021-12-11: 5 [IU] via SUBCUTANEOUS
  Administered 2021-12-11: 3 [IU] via SUBCUTANEOUS
  Administered 2021-12-11: 5 [IU] via SUBCUTANEOUS
  Administered 2021-12-12: 2 [IU] via SUBCUTANEOUS
  Administered 2021-12-12: 3 [IU] via SUBCUTANEOUS
  Administered 2021-12-12: 5 [IU] via SUBCUTANEOUS
  Administered 2021-12-13: 2 [IU] via SUBCUTANEOUS
  Administered 2021-12-13: 5 [IU] via SUBCUTANEOUS
  Administered 2021-12-13: 11 [IU] via SUBCUTANEOUS
  Administered 2021-12-14: 8 [IU] via SUBCUTANEOUS
  Administered 2021-12-14 (×2): 5 [IU] via SUBCUTANEOUS
  Administered 2021-12-15: 3 [IU] via SUBCUTANEOUS

## 2021-12-10 MED ORDER — FENTANYL CITRATE (PF) 100 MCG/2ML IJ SOLN
INTRAMUSCULAR | Status: AC
  Administered 2021-12-10: 50 ug
  Filled 2021-12-10: qty 2

## 2021-12-10 MED ORDER — HYDROMORPHONE HCL 1 MG/ML IJ SOLN
0.2500 mg | INTRAMUSCULAR | Status: DC | PRN
  Administered 2021-12-10 (×2): 0.5 mg via INTRAVENOUS

## 2021-12-10 MED ORDER — LACTATED RINGERS IV SOLN: INTRAVENOUS | Status: DC

## 2021-12-10 MED ORDER — DEXAMETHASONE SODIUM PHOSPHATE 10 MG/ML IJ SOLN
INTRAMUSCULAR | Status: AC
Start: 2021-12-10 — End: ?
  Filled 2021-12-10: qty 2

## 2021-12-10 MED ORDER — MENTHOL 3 MG MT LOZG: 1.0000 | LOZENGE | OROMUCOSAL | Status: DC | PRN

## 2021-12-10 MED ORDER — PANTOPRAZOLE SODIUM 40 MG IV SOLR
40.0000 mg | Freq: Every day | INTRAVENOUS | Status: DC
  Administered 2021-12-10: 40 mg via INTRAVENOUS
  Filled 2021-12-10: qty 10

## 2021-12-10 MED ORDER — POTASSIUM CHLORIDE IN NACL 20-0.9 MEQ/L-% IV SOLN: INTRAVENOUS | Status: DC

## 2021-12-10 MED ORDER — ORAL CARE MOUTH RINSE: 15.0000 mL | Freq: Once | OROMUCOSAL | Status: DC

## 2021-12-10 MED ORDER — MIDAZOLAM HCL 2 MG/2ML IJ SOLN
INTRAMUSCULAR | Status: AC
  Filled 2021-12-10: qty 2

## 2021-12-10 MED ORDER — 0.9 % SODIUM CHLORIDE (POUR BTL) OPTIME
TOPICAL | Status: DC | PRN
  Administered 2021-12-10: 1000 mL

## 2021-12-10 MED ORDER — ASCORBIC ACID 500 MG PO TABS
1000.0000 mg | ORAL_TABLET | Freq: Every day | ORAL | Status: DC
Start: 2021-12-11 — End: 2021-12-15
  Administered 2021-12-11 – 2021-12-15 (×5): 1000 mg via ORAL
  Filled 2021-12-10 (×5): qty 2

## 2021-12-10 MED ORDER — DEXAMETHASONE SODIUM PHOSPHATE 10 MG/ML IJ SOLN
INTRAMUSCULAR | Status: DC | PRN
  Administered 2021-12-10: 10 mg via INTRAVENOUS

## 2021-12-10 MED ORDER — PHENOL 1.4 % MT LIQD: 1.0000 | OROMUCOSAL | Status: DC | PRN

## 2021-12-10 MED ORDER — SODIUM CHLORIDE 0.9 % IV SOLN
250.0000 mL | INTRAVENOUS | Status: DC
  Administered 2021-12-11: 250 mL via INTRAVENOUS

## 2021-12-10 MED ORDER — OXYCODONE HCL 5 MG PO TABS
10.0000 mg | ORAL_TABLET | ORAL | Status: DC | PRN
  Administered 2021-12-11 – 2021-12-15 (×15): 10 mg via ORAL
  Filled 2021-12-10 (×15): qty 2

## 2021-12-10 MED ORDER — THROMBIN 5000 UNITS EX SOLR
OROMUCOSAL | Status: DC | PRN
  Administered 2021-12-10: 5 mL via TOPICAL

## 2021-12-10 MED ORDER — HYDROMORPHONE HCL 1 MG/ML IJ SOLN
INTRAMUSCULAR | Status: AC
  Filled 2021-12-10: qty 1

## 2021-12-10 MED ORDER — HYDROMORPHONE HCL 1 MG/ML IJ SOLN
0.5000 mg | INTRAMUSCULAR | Status: DC | PRN
  Administered 2021-12-11 (×2): 0.5 mg via INTRAVENOUS
  Filled 2021-12-10 (×2): qty 0.5

## 2021-12-10 MED ORDER — LIDOCAINE HCL (PF) 1 % IJ SOLN
5.0000 mL | Freq: Once | INTRAMUSCULAR | Status: AC
  Administered 2021-12-10: 5 mL via INTRADERMAL

## 2021-12-10 MED ORDER — BISACODYL 5 MG PO TBEC: 5.0000 mg | DELAYED_RELEASE_TABLET | Freq: Every day | ORAL | Status: DC | PRN

## 2021-12-10 MED ORDER — SODIUM CHLORIDE 0.9% FLUSH
3.0000 mL | Freq: Two times a day (BID) | INTRAVENOUS | Status: DC
Start: 2021-12-11 — End: 2021-12-15
  Administered 2021-12-11 – 2021-12-14 (×9): 3 mL via INTRAVENOUS

## 2021-12-10 MED ORDER — FENTANYL CITRATE (PF) 250 MCG/5ML IJ SOLN
INTRAMUSCULAR | Status: AC
  Filled 2021-12-10: qty 5

## 2021-12-10 MED ORDER — CEFAZOLIN SODIUM-DEXTROSE 1-4 GM/50ML-% IV SOLN
1.0000 g | Freq: Three times a day (TID) | INTRAVENOUS | Status: AC
  Administered 2021-12-10 – 2021-12-11 (×2): 1 g via INTRAVENOUS
  Filled 2021-12-10 (×2): qty 50

## 2021-12-10 MED ORDER — ZINC SULFATE 220 (50 ZN) MG PO CAPS
220.0000 mg | ORAL_CAPSULE | Freq: Every day | ORAL | Status: DC
Start: 2021-12-11 — End: 2021-12-15
  Administered 2021-12-11 – 2021-12-15 (×5): 220 mg via ORAL
  Filled 2021-12-10 (×5): qty 1

## 2021-12-10 MED ORDER — LIDOCAINE HCL (CARDIAC) PF 100 MG/5ML IV SOSY
PREFILLED_SYRINGE | INTRAVENOUS | Status: DC | PRN
  Administered 2021-12-10: 60 mg via INTRATRACHEAL

## 2021-12-10 MED ORDER — CELECOXIB 200 MG PO CAPS
200.0000 mg | ORAL_CAPSULE | Freq: Two times a day (BID) | ORAL | Status: DC
  Administered 2021-12-10 – 2021-12-12 (×4): 200 mg via ORAL
  Filled 2021-12-10 (×4): qty 1

## 2021-12-10 MED ORDER — VANCOMYCIN HCL 1000 MG IV SOLR
INTRAVENOUS | Status: AC
  Filled 2021-12-10: qty 20

## 2021-12-10 MED ORDER — METHOCARBAMOL 500 MG PO TABS
500.0000 mg | ORAL_TABLET | Freq: Four times a day (QID) | ORAL | Status: DC | PRN
  Administered 2021-12-11 – 2021-12-14 (×5): 500 mg via ORAL
  Filled 2021-12-10 (×6): qty 1

## 2021-12-10 MED ORDER — SODIUM CHLORIDE 0.9% FLUSH: 3.0000 mL | INTRAVENOUS | Status: DC | PRN

## 2021-12-10 MED ORDER — PHENYLEPHRINE 80 MCG/ML (10ML) SYRINGE FOR IV PUSH (FOR BLOOD PRESSURE SUPPORT)
PREFILLED_SYRINGE | INTRAVENOUS | Status: AC
Start: 2021-12-10 — End: ?
  Filled 2021-12-10: qty 40

## 2021-12-10 MED ORDER — CEFAZOLIN SODIUM-DEXTROSE 2-4 GM/100ML-% IV SOLN
INTRAVENOUS | Status: AC
  Filled 2021-12-10: qty 100

## 2021-12-10 MED ORDER — CHLORHEXIDINE GLUCONATE 0.12 % MT SOLN: 15.0000 mL | Freq: Once | OROMUCOSAL | Status: DC

## 2021-12-10 MED ORDER — ONDANSETRON HCL 4 MG PO TABS: 4.0000 mg | ORAL_TABLET | Freq: Four times a day (QID) | ORAL | Status: DC | PRN

## 2021-12-10 MED ORDER — ADULT MULTIVITAMIN W/MINERALS CH
1.0000 | ORAL_TABLET | Freq: Every day | ORAL | Status: DC
  Administered 2021-12-11 – 2021-12-15 (×5): 1 via ORAL
  Filled 2021-12-10 (×5): qty 1

## 2021-12-10 MED ORDER — SUGAMMADEX SODIUM 200 MG/2ML IV SOLN
INTRAVENOUS | Status: DC | PRN
  Administered 2021-12-10: 200 mg via INTRAVENOUS

## 2021-12-10 MED ORDER — PROPOFOL 10 MG/ML IV BOLUS
INTRAVENOUS | Status: DC | PRN
  Administered 2021-12-10: 120 mg via INTRAVENOUS

## 2021-12-10 MED ORDER — EPHEDRINE 5 MG/ML INJ
INTRAVENOUS | Status: AC
  Filled 2021-12-10: qty 5

## 2021-12-10 MED ORDER — ACETAMINOPHEN 500 MG PO TABS
1000.0000 mg | ORAL_TABLET | Freq: Four times a day (QID) | ORAL | Status: AC
  Administered 2021-12-10 – 2021-12-11 (×4): 1000 mg via ORAL
  Filled 2021-12-10 (×3): qty 2

## 2021-12-10 MED ORDER — LIDOCAINE 2% (20 MG/ML) 5 ML SYRINGE
INTRAMUSCULAR | Status: AC
  Filled 2021-12-10: qty 5

## 2021-12-10 MED ORDER — FENTANYL CITRATE (PF) 250 MCG/5ML IJ SOLN
INTRAMUSCULAR | Status: DC | PRN
  Administered 2021-12-10 (×2): 100 ug via INTRAVENOUS
  Administered 2021-12-10: 50 ug via INTRAVENOUS

## 2021-12-10 MED ORDER — SUCCINYLCHOLINE CHLORIDE 200 MG/10ML IV SOSY
PREFILLED_SYRINGE | INTRAVENOUS | Status: AC
  Filled 2021-12-10: qty 10

## 2021-12-10 MED ORDER — BUPIVACAINE HCL (PF) 0.25 % IJ SOLN
INTRAMUSCULAR | Status: AC
  Filled 2021-12-10: qty 30

## 2021-12-10 MED ORDER — IOHEXOL 180 MG/ML  SOLN
20.0000 mL | Freq: Once | INTRAMUSCULAR | Status: AC | PRN
  Administered 2021-12-10: 20 mL via INTRATHECAL

## 2021-12-10 MED ORDER — ROCURONIUM 10MG/ML (10ML) SYRINGE FOR MEDFUSION PUMP - OPTIME
INTRAVENOUS | Status: DC | PRN
  Administered 2021-12-10: 60 mg via INTRAVENOUS
  Administered 2021-12-10: 10 mg via INTRAVENOUS

## 2021-12-10 MED ORDER — ROCURONIUM BROMIDE 10 MG/ML (PF) SYRINGE
PREFILLED_SYRINGE | INTRAVENOUS | Status: AC
  Filled 2021-12-10: qty 10

## 2021-12-10 MED ORDER — VANCOMYCIN HCL 1000 MG IV SOLR
INTRAVENOUS | Status: DC | PRN
  Administered 2021-12-10: 1000 mg

## 2021-12-10 MED ORDER — ROCURONIUM BROMIDE 10 MG/ML (PF) SYRINGE
PREFILLED_SYRINGE | INTRAVENOUS | Status: AC
Start: 2021-12-10 — End: ?
  Filled 2021-12-10: qty 40

## 2021-12-10 MED ORDER — ONDANSETRON HCL 4 MG/2ML IJ SOLN
INTRAMUSCULAR | Status: DC | PRN
  Administered 2021-12-10: 4 mg via INTRAVENOUS

## 2021-12-10 MED ORDER — ADHERUS DURAL SEALANT
PACK | TOPICAL | Status: DC | PRN
  Administered 2021-12-10: 1 via TOPICAL

## 2021-12-10 MED ORDER — ALUM & MAG HYDROXIDE-SIMETH 200-200-20 MG/5ML PO SUSP
30.0000 mL | Freq: Four times a day (QID) | ORAL | Status: DC | PRN
Start: 2021-12-10 — End: 2021-12-15

## 2021-12-10 MED ORDER — ONDANSETRON HCL 4 MG/2ML IJ SOLN: 4.0000 mg | Freq: Four times a day (QID) | INTRAMUSCULAR | Status: DC | PRN

## 2021-12-10 MED ORDER — FENTANYL CITRATE PF 50 MCG/ML IJ SOSY
50.0000 ug | PREFILLED_SYRINGE | Freq: Once | INTRAMUSCULAR | Status: DC
  Filled 2021-12-10: qty 1

## 2021-12-10 MED ORDER — CHLORHEXIDINE GLUCONATE 0.12 % MT SOLN
OROMUCOSAL | Status: AC
  Filled 2021-12-10: qty 15

## 2021-12-10 MED ORDER — MIDAZOLAM HCL 2 MG/2ML IJ SOLN
INTRAMUSCULAR | Status: DC | PRN
  Administered 2021-12-10: 2 mg via INTRAVENOUS

## 2021-12-10 MED ORDER — LIDOCAINE 2% (20 MG/ML) 5 ML SYRINGE
INTRAMUSCULAR | Status: AC
  Filled 2021-12-10: qty 15

## 2021-12-10 SURGICAL SUPPLY — 54 items
BAG COUNTER SPONGE SURGICOUNT (BAG) ×3 IMPLANT
BAND RUBBER #18 3X1/16 STRL (MISCELLANEOUS) ×4 IMPLANT
BENZOIN TINCTURE PRP APPL 2/3 (GAUZE/BANDAGES/DRESSINGS) ×2 IMPLANT
BUR CARBIDE MATCH 3.0 (BURR) ×1 IMPLANT
CANISTER SUCT 3000ML PPV (MISCELLANEOUS) ×2 IMPLANT
COVER BACK TABLE 60X90IN (DRAPES) ×1 IMPLANT
DERMABOND ADHESIVE PROPEN (GAUZE/BANDAGES/DRESSINGS) ×1
DERMABOND ADVANCED (GAUZE/BANDAGES/DRESSINGS) ×1
DERMABOND ADVANCED .7 DNX12 (GAUZE/BANDAGES/DRESSINGS) IMPLANT
DERMABOND ADVANCED .7 DNX6 (GAUZE/BANDAGES/DRESSINGS) IMPLANT
DRAPE LAPAROTOMY 100X72X124 (DRAPES) ×2 IMPLANT
DRAPE MICROSCOPE LEICA (MISCELLANEOUS) ×2 IMPLANT
DRAPE SURG 17X23 STRL (DRAPES) ×2 IMPLANT
DRSG OPSITE POSTOP 4X10 (GAUZE/BANDAGES/DRESSINGS) ×1 IMPLANT
DURAPREP 26ML APPLICATOR (WOUND CARE) ×2 IMPLANT
ELECT REM PT RETURN 9FT ADLT (ELECTROSURGICAL) ×2
ELECTRODE REM PT RTRN 9FT ADLT (ELECTROSURGICAL) ×1 IMPLANT
GAUZE 4X4 16PLY ~~LOC~~+RFID DBL (SPONGE) IMPLANT
GLOVE BIO SURGEON STRL SZ 6.5 (GLOVE) ×1 IMPLANT
GLOVE BIO SURGEON STRL SZ7 (GLOVE) ×2 IMPLANT
GLOVE BIO SURGEON STRL SZ8 (GLOVE) ×2 IMPLANT
GLOVE BIOGEL PI IND STRL 7.0 (GLOVE) IMPLANT
GLOVE BIOGEL PI INDICATOR 7.0 (GLOVE) ×1
GOWN STRL REUS W/ TWL LRG LVL3 (GOWN DISPOSABLE) IMPLANT
GOWN STRL REUS W/ TWL XL LVL3 (GOWN DISPOSABLE) ×1 IMPLANT
GOWN STRL REUS W/TWL 2XL LVL3 (GOWN DISPOSABLE) IMPLANT
GOWN STRL REUS W/TWL LRG LVL3 (GOWN DISPOSABLE) ×2
GOWN STRL REUS W/TWL XL LVL3 (GOWN DISPOSABLE) ×1
GRAFT BONE PROTEIOS XL 10CC (Orthopedic Implant) ×1 IMPLANT
GRAFT DURAGEN MATRIX 2WX2L ×1 IMPLANT
HEMOSTAT POWDER KIT SURGIFOAM (HEMOSTASIS) ×2 IMPLANT
KIT BASIN OR (CUSTOM PROCEDURE TRAY) ×2 IMPLANT
KIT TURNOVER KIT B (KITS) ×2 IMPLANT
MATRIX STRIP NEOCORE 12C (Putty) IMPLANT
NDL HYPO 25X1 1.5 SAFETY (NEEDLE) ×1 IMPLANT
NDL SPNL 20GX3.5 QUINCKE YW (NEEDLE) IMPLANT
NEEDLE HYPO 25X1 1.5 SAFETY (NEEDLE) ×2 IMPLANT
NEEDLE SPNL 20GX3.5 QUINCKE YW (NEEDLE) IMPLANT
NS IRRIG 1000ML POUR BTL (IV SOLUTION) ×2 IMPLANT
PACK LAMINECTOMY NEURO (CUSTOM PROCEDURE TRAY) ×2 IMPLANT
PAD ARMBOARD 7.5X6 YLW CONV (MISCELLANEOUS) ×5 IMPLANT
STAPLER SKIN PROX WIDE 3.9 (STAPLE) ×1 IMPLANT
STRIP CLOSURE SKIN 1/2X4 (GAUZE/BANDAGES/DRESSINGS) ×4 IMPLANT
STRIP MATRIX NEOCORE 12CC (Putty) ×1 IMPLANT
SUT PROLENE 5 0 C1 (SUTURE) ×2 IMPLANT
SUT PROLENE 6 0 BV (SUTURE) ×4 IMPLANT
SUT VIC AB 0 CT1 18XCR BRD8 (SUTURE) ×1 IMPLANT
SUT VIC AB 0 CT1 8-18 (SUTURE) ×3
SUT VIC AB 2-0 CP2 18 (SUTURE) ×3 IMPLANT
SUT VIC AB 3-0 SH 8-18 (SUTURE) ×7 IMPLANT
SYR CONTROL 10ML LL (SYRINGE) ×1 IMPLANT
TOWEL GREEN STERILE (TOWEL DISPOSABLE) ×2 IMPLANT
TOWEL GREEN STERILE FF (TOWEL DISPOSABLE) ×2 IMPLANT
WATER STERILE IRR 1000ML POUR (IV SOLUTION) ×2 IMPLANT

## 2021-12-10 NOTE — Anesthesia Procedure Notes (Signed)
Procedure Name: Intubation Date/Time: 12/10/2021 8:10 PM Performed by: Valetta Fuller, CRNA Pre-anesthesia Checklist: Patient identified, Emergency Drugs available and Suction available Patient Re-evaluated:Patient Re-evaluated prior to induction Oxygen Delivery Method: Circle system utilized Preoxygenation: Pre-oxygenation with 100% oxygen Induction Type: IV induction Ventilation: Mask ventilation without difficulty Laryngoscope Size: Miller and 2 Grade View: Grade I Tube type: Oral Tube size: 7.5 mm Number of attempts: 1 Airway Equipment and Method: Stylet Placement Confirmation: ETT inserted through vocal cords under direct vision, positive ETCO2 and breath sounds checked- equal and bilateral Secured at: 23 cm Tube secured with: Tape Dental Injury: Teeth and Oropharynx as per pre-operative assessment

## 2021-12-10 NOTE — Op Note (Signed)
12/10/2021  10:13 PM  PATIENT:  Cody Berger  56 y.o. male  PRE-OPERATIVE DIAGNOSIS: Postoperative pseudomeningocele  POST-OPERATIVE DIAGNOSIS:  same  PROCEDURE: Thoracolumbar reexploration with exploration of fusion, repair of durotomy L2-3 on the left, and redo posterior lateral arthrodesis L1-L3 on the right utilizing morselized allograft  SURGEON:  Sherley Bounds, MD  ASSISTANTS: Glenford Peers FNP  ANESTHESIA:   General  EBL: Less than ml  Total I/O In: 1200 [I.V.:1200] Out: 200 [Urine:200]  BLOOD ADMINISTERED: none  DRAINS: None  SPECIMEN:  none  INDICATION FOR PROCEDURE: This patient presented with headaches and drainage from his wound and back pain with leg pain. Imaging showed CSF leak at the decompression site at L2-3. The patient tried conservative measures without relief. Pain was debilitating. Recommended wound exploration with repair of CSF leak.  [place] showed that the posterolateral bone placed for the arthrodesis had washed out into the soft tissues and was no longer in good position.  Patient understood the risks, benefits, and alternatives and potential outcomes and wished to proceed.  PROCEDURE DETAILS: The patient was taken to the operating room and after induction of adequate generalized endotracheal anesthesia, the patient was rolled into the prone position on chest rolls and all pressure points were padded. The l thoracic and umbar region was cleaned with Betadine scrub and then prepped with DuraPrep and draped in the usual sterile fashion. 5 cc of local anesthesia was injected and then a dorsal midline incision was made through the old incision with immediate release of spinal fluid.  This was removed with suction.  Section was carried down to the thoracic and lumbar fascia. The fascia was opened and the paraspinous musculature was taken down in a subperiosteal fashion to expose all of the instrumentation from T11-L4 as well as the previous bone graft, much of  which had washed into the soft tissues.    There was obvious CSF coming from the decompression site at L2-3 and there was a 1 cm durotomy posteriorly.  There were many pieces of bone from the posterior lateral fusion that were in the epidural space and these were removed.  We used 6-0 Prolene, and performed a running suture to close the durotomy.  His dura was paperthin, and tore very easily.  We therefore ran 2 more sutures to close dural tears that were created during the suturing process because of the poor tissue quality of the dura.  On the final running suture, we took a piece of muscle and sewed this down over the defect and this appeared to stop the leak.  We then checked a Valsalva up to 30 and saw no leakage of spinal fluid from the repair site.  I irrigated with saline solution containing bacitracin.  We inspected the instrumentation and all appeared to have good purchase and appeared solid.  Because much of the previously placed bone graft had washed out of place we put it back into place in the intertransverse space as best we could and added to this with more morselized allograft to redo the fusion between L1 and L3 on the right.  We achieved hemostasis with bipolar cautery, lined the dura with DuraGen and more muscle and then Tisseel fibrin glue, and then closed the fascia with 0 Vicryl. I closed the subcutaneous tissues with 2-0 Vicryl and the subcuticular tissues with 3-0 Vicryl. The skin was then closed with benzoin and Steri-Strips. The drapes were removed, a sterile dressing was applied.  My nurse practitioner was involved in the exposure,  the dural repair, the redo fusion, and the closure. the patient was awakened from general anesthesia and transferred to the recovery room in stable condition. At the end of the procedure all sponge, needle and instrument counts were correct.    PLAN OF CARE: Admit to inpatient   PATIENT DISPOSITION:  PACU - hemodynamically stable.   Delay start of  Pharmacological VTE agent (>24hrs) due to surgical blood loss or risk of bleeding:  yes

## 2021-12-10 NOTE — Progress Notes (Signed)
Subjective: Patient reports posterior headache, back and hip pain  Objective: Vital signs in last 24 hours: Temp:  [97.5 F (36.4 C)-98.5 F (36.9 C)] 97.7 F (36.5 C) (06/07 0805) Pulse Rate:  [59-86] 75 (06/07 0805) Resp:  [16-22] 18 (06/07 0805) BP: (142-157)/(67-90) 142/90 (06/07 0805) SpO2:  [91 %-98 %] 95 % (06/07 0805)  Intake/Output from previous day: 06/06 0701 - 06/07 0700 In: 598.9 [I.V.:598.9] Out: 1150 [Urine:1150] Intake/Output this shift: No intake/output data recorded.  Neurologic: Grossly normal, but incision now fluctuant and leaking pink fluid  Lab Results: Lab Results  Component Value Date   WBC 14.0 (H) 12/09/2021   HGB 11.3 (L) 12/09/2021   HCT 33.6 (L) 12/09/2021   MCV 92.8 12/09/2021   PLT 457 (H) 12/09/2021   Lab Results  Component Value Date   INR 1.1 12/08/2021   BMET Lab Results  Component Value Date   NA 134 (L) 12/07/2021   K 3.6 12/07/2021   CL 101 12/07/2021   CO2 18 (L) 12/07/2021   GLUCOSE 219 (H) 12/07/2021   BUN 15 12/07/2021   CREATININE 0.77 12/08/2021   CALCIUM 8.7 (L) 12/07/2021    Studies/Results: CT LUMBAR SPINE W CONTRAST  Result Date: 12/10/2021 CLINICAL DATA:  Previous lumbar fusion T10-L4 with decompression at L2 for treatment of T12, L2 and L4 compression fractures. Postoperative pain in the back and bilateral hips with weakness. EXAM: LUMBAR MYELOGRAM FLUOROSCOPY: 1 minute 18 seconds.  1083.85 micro gray meter squared PROCEDURE: After thorough discussion of risks and benefits of the procedure including bleeding, infection, injury to nerves, blood vessels, adjacent structures as well as headache and CSF leak, written and oral informed consent was obtained. Consent was obtained by Dr. Nelson Chimes. Time out form was completed. Patient was positioned prone on the fluoroscopy table. Local anesthesia was provided with 1% lidocaine without epinephrine after prepped and draped in the usual sterile fashion. Puncture was  performed at L5-S1 using a 3 1/2 inch 22-gauge spinal needle via right paramedian approach. Using a single pass through the dura, the needle was placed within the thecal sac, with return of clear CSF. 20 cc of Omnipaque 180 was injected into the thecal sac, with normal opacification of the nerve roots and cauda equina consistent with free flow within the subarachnoid space. I personally performed the lumbar puncture and administered the intrathecal contrast. I also personally performed acquisition of the myelogram images. TECHNIQUE: Contiguous axial images were obtained through the Lumbar spine after the intrathecal infusion of infusion. Coronal and sagittal reconstructions were obtained of the axial image sets. COMPARISON:  Multiple prior imaging studies since 11/24/2021 FINDINGS: LUMBAR MYELOGRAM FINDINGS: No compressive stenosis of the canal. Small anterior extradural defect at L2 due to retropulsed bone, but the surgical decompression as a good appearance. No evidence of hardware complication. Large CSF leak towards the left emanating from the L2-3 region. CT LUMBAR MYELOGRAM FINDINGS: Pedicle screws and posterior rods from T11 to L4, with exception of the level of the burst compression fracture at L2. Pedicle screws appear well positioned. No evidence of disconnection or hardware complication. Sufficient patency of the spinal canal throughout the region. At the level of the L2 burst compression fracture, there is retropulsion of 5 mm, not increased. Good posterior decompression. There does not appear to be any neural compression. There is a CSF leak emanating from the left side at the level of inferior L2 in the L2-3 disc. Contrast opacifies the fluid collection previously shown throughout the operative region  of hardware placement from the T11-12 disc level down as far as the L4 hardware. Minor superior endplate fractures at R42 and L4 appear unchanged and unremarkable. IMPRESSION: Spinal fusion from T11  through L4. Posterior decompression at L2. No change in the appearance of the L2 burst fracture with retropulsion of 5 mm. Large CSF leak/dural defect on the left side at the level of inferior L2 and the L2-3 disc space, with contrast completely opacifying the fluid collection previously seen posteriorly along the hardware from T11-L4. Electronically Signed   By: Nelson Chimes M.D.   On: 12/10/2021 11:32   DG MYELOGRAPHY LUMBAR INJ LUMBOSACRAL  Result Date: 12/10/2021 CLINICAL DATA:  Previous lumbar fusion T10-L4 with decompression at L2 for treatment of T12, L2 and L4 compression fractures. Postoperative pain in the back and bilateral hips with weakness. EXAM: LUMBAR MYELOGRAM FLUOROSCOPY: 1 minute 18 seconds.  1083.85 micro gray meter squared PROCEDURE: After thorough discussion of risks and benefits of the procedure including bleeding, infection, injury to nerves, blood vessels, adjacent structures as well as headache and CSF leak, written and oral informed consent was obtained. Consent was obtained by Dr. Nelson Chimes. Time out form was completed. Patient was positioned prone on the fluoroscopy table. Local anesthesia was provided with 1% lidocaine without epinephrine after prepped and draped in the usual sterile fashion. Puncture was performed at L5-S1 using a 3 1/2 inch 22-gauge spinal needle via right paramedian approach. Using a single pass through the dura, the needle was placed within the thecal sac, with return of clear CSF. 20 cc of Omnipaque 180 was injected into the thecal sac, with normal opacification of the nerve roots and cauda equina consistent with free flow within the subarachnoid space. I personally performed the lumbar puncture and administered the intrathecal contrast. I also personally performed acquisition of the myelogram images. TECHNIQUE: Contiguous axial images were obtained through the Lumbar spine after the intrathecal infusion of infusion. Coronal and sagittal reconstructions were  obtained of the axial image sets. COMPARISON:  Multiple prior imaging studies since 11/24/2021 FINDINGS: LUMBAR MYELOGRAM FINDINGS: No compressive stenosis of the canal. Small anterior extradural defect at L2 due to retropulsed bone, but the surgical decompression as a good appearance. No evidence of hardware complication. Large CSF leak towards the left emanating from the L2-3 region. CT LUMBAR MYELOGRAM FINDINGS: Pedicle screws and posterior rods from T11 to L4, with exception of the level of the burst compression fracture at L2. Pedicle screws appear well positioned. No evidence of disconnection or hardware complication. Sufficient patency of the spinal canal throughout the region. At the level of the L2 burst compression fracture, there is retropulsion of 5 mm, not increased. Good posterior decompression. There does not appear to be any neural compression. There is a CSF leak emanating from the left side at the level of inferior L2 in the L2-3 disc. Contrast opacifies the fluid collection previously shown throughout the operative region of hardware placement from the T11-12 disc level down as far as the L4 hardware. Minor superior endplate fractures at H06 and L4 appear unchanged and unremarkable. IMPRESSION: Spinal fusion from T11 through L4. Posterior decompression at L2. No change in the appearance of the L2 burst fracture with retropulsion of 5 mm. Large CSF leak/dural defect on the left side at the level of inferior L2 and the L2-3 disc space, with contrast completely opacifying the fluid collection previously seen posteriorly along the hardware from T11-L4. Electronically Signed   By: Jan Fireman.D.  On: 12/10/2021 11:32    Assessment/Plan: CT/ myelo reviewed, and this shows a CSF l;eak with pseudomeningocele. We rec wound exploration and repair of leak if possible. He understands the risks of the surgery include but are not limited to bleeding , infection, csf leak, continued leak, need for further  surgery, SCI, nerve injury, NTW, paralysis, pseudoarthrosis, lack of relief, worsening sxs, and anesthesia risks including DVT, pneumonia, and death. He agrees to proceed.   Estimated body mass index is 23.57 kg/m as calculated from the following:   Height as of this encounter: '5\' 8"'$  (1.727 m).   Weight as of this encounter: 70.3 kg.    LOS: 3 days    Eustace Moore 12/10/2021, 11:43 AM

## 2021-12-10 NOTE — Progress Notes (Signed)
Myelogram/LP performed by Dr. Maree Erie (R)side L5-S1 puncture Blood tinged CSF 20 mL contrast injection Pt tolerated well. No complications.  Ascencion Dike PA-C Interventional Radiology 12/10/2021 10:15 AM

## 2021-12-10 NOTE — Anesthesia Preprocedure Evaluation (Addendum)
Anesthesia Evaluation  Patient identified by MRN, date of birth, ID band Patient awake    Reviewed: Allergy & Precautions, NPO status , Patient's Chart, lab work & pertinent test results  Airway Mallampati: II  TM Distance: >3 FB Neck ROM: Full    Dental no notable dental hx. (+) Edentulous Upper, Edentulous Lower   Pulmonary Current Smoker and Patient abstained from smoking.,  1ppd x many years No inhalers    Pulmonary exam normal breath sounds clear to auscultation       Cardiovascular hypertension (126/87 in preop), Pt. on medications and Pt. on home beta blockers Normal cardiovascular exam Rhythm:Regular Rate:Normal     Neuro/Psych negative neurological ROS  negative psych ROS   GI/Hepatic negative GI ROS, Neg liver ROS,   Endo/Other  diabetes, Well Controlled, Type 2, Oral Hypoglycemic AgentsFS 201 in preop  Renal/GU negative Renal ROS  negative genitourinary   Musculoskeletal Lumbar fx   Abdominal   Peds  Hematology negative hematology ROS (+)   Anesthesia Other Findings   Reproductive/Obstetrics negative OB ROS                            Anesthesia Physical  Anesthesia Plan  ASA: 3  Anesthesia Plan: General   Post-op Pain Management: Ofirmev IV (intra-op)*   Induction: Intravenous  PONV Risk Score and Plan: 2 and Ondansetron, Dexamethasone, Midazolam and Treatment may vary due to age or medical condition  Airway Management Planned: Oral ETT  Additional Equipment: None  Intra-op Plan:   Post-operative Plan: Extubation in OR  Informed Consent: I have reviewed the patients History and Physical, chart, labs and discussed the procedure including the risks, benefits and alternatives for the proposed anesthesia with the patient or authorized representative who has indicated his/her understanding and acceptance.     Dental advisory given  Plan Discussed with:  CRNA  Anesthesia Plan Comments:        Anesthesia Quick Evaluation

## 2021-12-11 ENCOUNTER — Encounter (HOSPITAL_COMMUNITY): Payer: Self-pay | Admitting: Neurological Surgery

## 2021-12-11 LAB — GLUCOSE, CAPILLARY
Glucose-Capillary: 166 mg/dL — ABNORMAL HIGH (ref 70–99)
Glucose-Capillary: 221 mg/dL — ABNORMAL HIGH (ref 70–99)
Glucose-Capillary: 249 mg/dL — ABNORMAL HIGH (ref 70–99)
Glucose-Capillary: 268 mg/dL — ABNORMAL HIGH (ref 70–99)

## 2021-12-11 LAB — COMPREHENSIVE METABOLIC PANEL
ALT: 23 U/L (ref 0–44)
AST: 16 U/L (ref 15–41)
Albumin: 2.3 g/dL — ABNORMAL LOW (ref 3.5–5.0)
Alkaline Phosphatase: 106 U/L (ref 38–126)
Anion gap: 12 (ref 5–15)
BUN: 25 mg/dL — ABNORMAL HIGH (ref 6–20)
CO2: 21 mmol/L — ABNORMAL LOW (ref 22–32)
Calcium: 8.4 mg/dL — ABNORMAL LOW (ref 8.9–10.3)
Chloride: 106 mmol/L (ref 98–111)
Creatinine, Ser: 0.72 mg/dL (ref 0.61–1.24)
GFR, Estimated: >60 mL/min (ref >60)
Glucose, Bld: 218 mg/dL — ABNORMAL HIGH (ref 70–99)
Potassium: 4.7 mmol/L (ref 3.5–5.1)
Sodium: 139 mmol/L (ref 135–145)
Total Bilirubin: 1.1 mg/dL (ref 0.3–1.2)
Total Protein: 5.4 g/dL — ABNORMAL LOW (ref 6.5–8.1)

## 2021-12-11 LAB — CBC
HCT: 33.6 % — ABNORMAL LOW (ref 39.0–52.0)
Hemoglobin: 11.3 g/dL — ABNORMAL LOW (ref 13.0–17.0)
MCH: 31.9 pg (ref 26.0–34.0)
MCHC: 33.6 g/dL (ref 30.0–36.0)
MCV: 94.9 fL (ref 80.0–100.0)
Platelets: 482 10*3/uL — ABNORMAL HIGH (ref 150–400)
RBC: 3.54 MIL/uL — ABNORMAL LOW (ref 4.22–5.81)
RDW: 12.1 % (ref 11.5–15.5)
WBC: 9.2 10*3/uL (ref 4.0–10.5)
nRBC: 0 % (ref 0.0–0.2)

## 2021-12-11 MED ORDER — ENSURE MAX PROTEIN PO LIQD
11.0000 [oz_av] | Freq: Every day | ORAL | Status: DC
  Administered 2021-12-11 – 2021-12-13 (×2): 11 [oz_av] via ORAL
  Filled 2021-12-11 (×5): qty 330

## 2021-12-11 MED ORDER — PANTOPRAZOLE SODIUM 40 MG PO TBEC
40.0000 mg | DELAYED_RELEASE_TABLET | Freq: Every day | ORAL | Status: DC
  Administered 2021-12-11 – 2021-12-14 (×4): 40 mg via ORAL
  Filled 2021-12-11 (×4): qty 1

## 2021-12-11 MED ORDER — CHLORHEXIDINE GLUCONATE CLOTH 2 % EX PADS
6.0000 | MEDICATED_PAD | Freq: Every day | CUTANEOUS | Status: DC
  Administered 2021-12-11 – 2021-12-14 (×3): 6 via TOPICAL

## 2021-12-11 MED ORDER — GLUCERNA SHAKE PO LIQD
237.0000 mL | Freq: Three times a day (TID) | ORAL | Status: DC
  Administered 2021-12-11 – 2021-12-15 (×9): 237 mL via ORAL
  Filled 2021-12-11 (×3): qty 237

## 2021-12-11 NOTE — Plan of Care (Signed)

## 2021-12-11 NOTE — Transfer of Care (Signed)
Immediate Anesthesia Transfer of Care Note  Patient: Cody Berger  Procedure(s) Performed: Exploration of ThoracoLumbar Wound, Repair of  Cerebrospinal Fluid Leak, Redo Fusion Lumbar One - Lumbar Three (Back)  Patient Location: PACU  Anesthesia Type:General  Level of Consciousness: awake and sedated  Airway & Oxygen Therapy: Patient Spontanous Breathing  Post-op Assessment: Report given to RN and Post -op Vital signs reviewed and stable  Post vital signs: Reviewed and stable  Last Vitals:  Vitals Value Taken Time  BP 129/74 12/11/21 0310  Temp 36.7 C 12/11/21 0310  Pulse 80 12/11/21 0406  Resp 18 12/11/21 0406  SpO2 90 % 12/11/21 0406  Vitals shown include unvalidated device data.  Last Pain:  Vitals:   12/11/21 0310  TempSrc: Oral  PainSc:       Patients Stated Pain Goal: 1 (37/10/62 6948)  Complications: No notable events documented.

## 2021-12-11 NOTE — Progress Notes (Signed)
Still has back and hip pain, no headache lying flat, dressing dry and flat, moving legs well. Continue flat BR for now.

## 2021-12-12 LAB — GLUCOSE, CAPILLARY
Glucose-Capillary: 180 mg/dL — ABNORMAL HIGH (ref 70–99)
Glucose-Capillary: 234 mg/dL — ABNORMAL HIGH (ref 70–99)
Glucose-Capillary: 150 mg/dL — ABNORMAL HIGH (ref 70–99)
Glucose-Capillary: 169 mg/dL — ABNORMAL HIGH (ref 70–99)

## 2021-12-12 MED ORDER — ACETAMINOPHEN 500 MG PO TABS
1000.0000 mg | ORAL_TABLET | Freq: Four times a day (QID) | ORAL | Status: AC
  Administered 2021-12-12 – 2021-12-13 (×5): 1000 mg via ORAL
  Filled 2021-12-12 (×5): qty 2

## 2021-12-12 NOTE — Progress Notes (Signed)
Inpatient Diabetes Program Recommendations  AACE/ADA: New Consensus Statement on Inpatient Glycemic Control (2015)  Target Ranges:  Prepandial:   less than 140 mg/dL      Peak postprandial:   less than 180 mg/dL (1-2 hours)      Critically ill patients:  140 - 180 mg/dL   Lab Results  Component Value Date   GLUCAP 234 (H) 12/12/2021   HGBA1C 7.8 (H) 12/02/2021    Review of Glycemic Control  Latest Reference Range & Units 12/11/21 08:12 12/11/21 11:44 12/11/21 16:04 12/11/21 21:04 12/12/21 07:45 12/12/21 11:11  Glucose-Capillary 70 - 99 mg/dL 166 (H) 221 (H) 249 (H) 268 (H) 180 (H) 234 (H)  (H): Data is abnormally high  Diabetes history:  DM2 Outpatient Diabetes medications: Ozempic 1 mg weekly Current orders for Inpatient glycemic control: Novolog 0-15 unit TID   Inpatient Diabetes Program Recommendations:    Postprandials elevated.  Please consider:  Novolog 3 units TID with meals if consumes at least 50%  Will continue to follow while inpatient.  Thank you, Reche Dixon, MSN, [place] Diabetes Coordinator Inpatient Diabetes Program [phone] (team pager from 8a-5p)

## 2021-12-12 NOTE — Progress Notes (Signed)
He continues to complain of back pain and headache.  He is awake and alert and conversant.  His dressing is dry and flat.  No evidence of leak.  Good movement in his legs.  Continue flat bedrest for another day.

## 2021-12-12 NOTE — TOC Progression Note (Signed)
Transition of Care [facility]) - Progression Note    Patient Details  Name: Cody Berger MRN: [id number] Date of Birth: 1965/10/30  Transition of Care [facility]) CM/SW [place], [place] Phone Number: 12/12/2021, 10:49 AM  Clinical Narrative:     TOC continues to follow   Expected Discharge Plan:  (TBD) Barriers to Discharge: Continued Medical Work up (PT/OT [place])  Expected Discharge Plan and Services Expected Discharge Plan:  (TBD) In-house Referral: Clinical Social Work     Living arrangements for the past 2 months: Single Family Home                                       Social Determinants of Health (SDOH) Interventions    Readmission Risk Interventions     No data to display

## 2021-12-13 LAB — GLUCOSE, CAPILLARY
Glucose-Capillary: 320 mg/dL — ABNORMAL HIGH (ref 70–99)
Glucose-Capillary: 143 mg/dL — ABNORMAL HIGH (ref 70–99)
Glucose-Capillary: 246 mg/dL — ABNORMAL HIGH (ref 70–99)
Glucose-Capillary: 264 mg/dL — ABNORMAL HIGH (ref 70–99)

## 2021-12-13 LAB — BASIC METABOLIC PANEL
Anion gap: 10 (ref 5–15)
BUN: 21 mg/dL — ABNORMAL HIGH (ref 6–20)
CO2: 26 mmol/L (ref 22–32)
Calcium: 8.7 mg/dL — ABNORMAL LOW (ref 8.9–10.3)
Chloride: 104 mmol/L (ref 98–111)
Creatinine, Ser: 0.81 mg/dL (ref 0.61–1.24)
GFR, Estimated: >60 mL/min (ref >60)
Glucose, Bld: 183 mg/dL — ABNORMAL HIGH (ref 70–99)
Potassium: 3.9 mmol/L (ref 3.5–5.1)
Sodium: 140 mmol/L (ref 135–145)

## 2021-12-13 LAB — CBC
HCT: 38 % — ABNORMAL LOW (ref 39.0–52.0)
Hemoglobin: 12.9 g/dL — ABNORMAL LOW (ref 13.0–17.0)
MCH: 31.5 pg (ref 26.0–34.0)
MCHC: 33.9 g/dL (ref 30.0–36.0)
MCV: 92.7 fL (ref 80.0–100.0)
Platelets: 554 10*3/uL — ABNORMAL HIGH (ref 150–400)
RBC: 4.1 MIL/uL — ABNORMAL LOW (ref 4.22–5.81)
RDW: 12.1 % (ref 11.5–15.5)
WBC: 13.3 10*3/uL — ABNORMAL HIGH (ref 4.0–10.5)
nRBC: 0 % (ref 0.0–0.2)

## 2021-12-13 NOTE — Evaluation (Signed)
Physical Therapy Evaluation Patient Details Name: Cody Berger MRN: [id number] DOB: December 22, 1965 Today's Date: 12/13/2021  History of Present Illness  56 y/o male presented to ED on 12/07/21 for increasing lower back pain and bloody discharge from lumbar wound from back surgery on 5/31. MRI showed blood in thecal sac. S/p thoracolumbar reexploration with repair of durotomy L2-3 on L and redo posterior lateral arthrodesis L1-3 on R. PMH: DM, PTSD, hx of back sx  Clinical Impression  Patient admitted with above. PTA, patient was living with wife and difficulty mobilizing with RW due to significant pain. Patient presents with weakness, decreased activity tolerance, impaired balance, and pain. Reviewed back precautions, brace wear, and activity progression, patient verbalized understanding. Educated patient on current mobility limitations per MD as "gentle mobility" and will progress mobility each day. This session focused on upright tolerance and initiating mobility. Patient required modA+2 for bed mobility and sit to stand transfer. Able to sidestep and perform standing marching with min guard. Will continue to progress next session. Patient will benefit from skilled PT services during acute stay to address listed deficits. Recommend HHPT at this time to maximize functional mobility and safety.   Orthostatic BPs  Supine 77/49  Sitting 116/69  Standing 102/86  Standing after 3 min 102/74         Recommendations for follow up therapy are one component of a multi-disciplinary discharge planning process, led by the attending physician.  Recommendations may be updated based on patient status, additional functional criteria and insurance authorization.  Follow Up Recommendations Home health PT    Assistance Recommended at Discharge Frequent or constant Supervision/Assistance  Patient can return home with the following  A little help with bathing/dressing/bathroom;A little help with walking and/or  transfers;Assistance with cooking/housework;Assist for transportation    Equipment Recommendations Rolling Cosmo Tetreault (2 wheels)  Recommendations for Other Services       Functional Status Assessment Patient has had a recent decline in their functional status and demonstrates the ability to make significant improvements in function in a reasonable and predictable amount of time.     Precautions / Restrictions Precautions Precautions: Back;Fall Precaution Booklet Issued: Yes (comment) Precaution Comments: able to recall 2/3 back precautions from previous sx; reviewed back precautions with patient and wife Required Braces or Orthoses: Spinal Brace Spinal Brace: Thoracolumbosacral orthotic;Applied in sitting position Restrictions Weight Bearing Restrictions: No      Mobility  Bed Mobility Overal bed mobility: Needs Assistance Bed Mobility: Sidelying to Sit, Sit to Sidelying   Sidelying to sit: Mod assist, +2 for safety/equipment     Sit to sidelying: Min assist General bed mobility comments: increased time and effort to come to EOB. ModA for trunk elevation to EOB, +2 for safety. MinA to bring LEs back into bed    Transfers Overall transfer level: Needs assistance Equipment used: Rolling Lupie Sawa (2 wheels) Transfers: Sit to/from Stand Sit to Stand: Mod assist, +2 safety/equipment           General transfer comment: cues for hand placement as patient wanting to pull up on RW. Initially rising with min guard but with transitioning of hands required modA to maintain balance during hand transition.    Ambulation/Gait Ambulation/Gait assistance: Min guard   Assistive device: Rolling Dhwani Venkatesh (2 wheels)       Pre-gait activities: standing marching and sidestepped at EOB with RW and min guard    Stairs            Wheelchair Mobility    Modified Rankin (  Stroke Patients Only)       Balance Overall balance assessment: Needs assistance Sitting-balance support: No  upper extremity supported, Feet supported Sitting balance-Leahy Scale: Fair     Standing balance support: Bilateral upper extremity supported, Reliant on assistive device for balance Standing balance-Leahy Scale: Poor Standing balance comment: reliant on UE support                             Pertinent Vitals/Pain Pain Assessment Pain Assessment: Faces Faces Pain Scale: Hurts even more Pain Location: surgical site Pain Descriptors / Indicators: Aching, Sore Pain Intervention(s): Monitored during session, Premedicated before session, Repositioned    Home Living Family/patient expects to be discharged to:: Private residence Living Arrangements: Spouse/significant other Available Help at Discharge: Available PRN/intermittently;Family Type of Home: Apartment Home Access: Level entry       Home Layout: One level Home Equipment: Cane - single point;Shower Land (2 wheels)      Prior Function Prior Level of Function : Independent/Modified Independent             Mobility Comments: uses cane in home when hips are bothering him, and always in the community prior to surgery. After surgery, patient utilizing RW at discharge ADLs Comments: patient reports independent prior to surgery unless hips are painful. Wife will assist as needed     Hand Dominance        Extremity/Trunk Assessment   Upper Extremity Assessment Upper Extremity Assessment: Defer to OT evaluation    Lower Extremity Assessment Lower Extremity Assessment: Generalized weakness (hx of B hip pain)    Cervical / Trunk Assessment Cervical / Trunk Assessment: Back Surgery  Communication   Communication: No difficulties  Cognition Arousal/Alertness: Awake/alert Behavior During Therapy: WFL for tasks assessed/performed Overall Cognitive Status: Within Functional Limits for tasks assessed                                 General Comments: tangential and talkative         General Comments      Exercises     Assessment/Plan    PT Assessment Patient needs continued PT services  PT Problem List Decreased strength;Decreased activity tolerance;Decreased balance;Decreased mobility       PT Treatment Interventions DME instruction;Gait training;Functional mobility training;Therapeutic activities;Therapeutic exercise;Balance training;Patient/family education    PT Goals (Current goals can be found in the Care Plan section)  Acute Rehab PT Goals Patient Stated Goal: to go home PT Goal Formulation: With patient/family Time For Goal Achievement: 12/27/21 Potential to Achieve Goals: Good    Frequency Min 5X/week     Co-evaluation PT/OT/SLP Co-Evaluation/Treatment: Yes Reason for Co-Treatment: For patient/therapist safety;To address functional/ADL transfers PT goals addressed during session: Mobility/safety with mobility;Balance;Proper use of DME         AM-PAC PT "6 Clicks" Mobility  Outcome Measure Help needed turning from your back to your side while in a flat bed without using bedrails?: A Little Help needed moving from lying on your back to sitting on the side of a flat bed without using bedrails?: A Lot Help needed moving to and from a bed to a chair (including a wheelchair)?: A Lot Help needed standing up from a chair using your arms (e.g., wheelchair or bedside chair)?: A Lot Help needed to walk in hospital room?: A Little Help needed climbing 3-5 steps with a railing? : A Lot  6 Click Score: 14    End of Session Equipment Utilized During Treatment: Back brace Activity Tolerance: Patient tolerated treatment well Patient left: in bed;with call bell/phone within reach;with family/visitor present Nurse Communication: Mobility status PT Visit Diagnosis: Muscle weakness (generalized) (M62.81);Unsteadiness on feet (R26.81);Other abnormalities of gait and mobility (R26.89)    Time: 4068-4033 PT Time Calculation (min) (ACUTE ONLY): 39  min   Charges:   PT Evaluation $PT Eval Moderate Complexity: 1 Mod PT Treatments $Therapeutic Activity: 8-22 mins        Faelyn Sigler A. Gilford Rile PT, DPT Acute Rehabilitation Services Office [phone]   Linna Hoff 12/13/2021, 2:58 PM

## 2021-12-13 NOTE — Progress Notes (Signed)
No new issues or problems overnight.  Patient complains of incisional pain but denies lower extremity symptoms.  He is remained flat on bedrest.  Patient is afebrile.  His vital signs are stable.  His urine output is good.  Motor and sensory function are stable.  His wound is healing well.  His dressing is dry and flat.  Status post reexploration of wound with repair of CSF leak.  We will begin to mobilize gently today.

## 2021-12-13 NOTE — Evaluation (Signed)
Occupational Therapy Evaluation Patient Details Name: Cody Berger MRN: [id number] DOB: Mar 19, 1966 Today's Date: 12/13/2021   History of Present Illness 56 y/o male presented to ED on 12/07/21 for increasing lower back pain and bloody discharge from lumbar wound from back surgery on 5/31. MRI showed blood in thecal sac. S/p thoracolumbar reexploration with repair of durotomy L2-3 on L and redo posterior lateral arthrodesis L1-3 on R. PMH: DM, PTSD, hx of back sx   Clinical Impression   Prior to this admission, patient with increased pain due to previous surgery and requiring assist with lower body ADLs. Currently, patient is min A for ADLs and transfers (increased time and encouragement to complete, and able to be at min guard level by end of session with side steps to [facility]), with patient on current mobility limitations per MD as "gentle mobility" and will progress mobility each day. Patient and wife in agreement. BP assessed throughout due to prolonged bed rest with levels outlined below. OT recommending HHOT at discharge due to current level of function with OT to follow acutely.    Orthostatic BPs   Supine 77/49  Sitting 116/69  Standing 102/86  Standing after 3 min 102/74       Recommendations for follow up therapy are one component of a multi-disciplinary discharge planning process, led by the attending physician.  Recommendations may be updated based on patient status, additional functional criteria and insurance authorization.   Follow Up Recommendations  Home health OT    Assistance Recommended at Discharge Intermittent Supervision/Assistance  Patient can return home with the following A little help with walking and/or transfers;A lot of help with bathing/dressing/bathroom;Assistance with cooking/housework;Assist for transportation    Functional Status Assessment  Patient has had a recent decline in their functional status and demonstrates the ability to make significant  improvements in function in a reasonable and predictable amount of time.  Equipment Recommendations  Other (comment) (Will continue to assess)    Recommendations for Other Services       Precautions / Restrictions Precautions Precautions: Back;Fall Precaution Booklet Issued: Yes (comment) Precaution Comments: able to recall 2/3 back precautions from previous sx; reviewed back precautions with patient and wife Required Braces or Orthoses: Spinal Brace Spinal Brace: Thoracolumbosacral orthotic;Applied in sitting position Restrictions Weight Bearing Restrictions: No      Mobility Bed Mobility Overal bed mobility: Needs Assistance Bed Mobility: Sidelying to Sit, Sit to Sidelying   Sidelying to sit: Mod assist, +2 for safety/equipment     Sit to sidelying: Min assist General bed mobility comments: increased time and effort to come to EOB. ModA for trunk elevation to EOB, +2 for safety. MinA to bring LEs back into bed    Transfers Overall transfer level: Needs assistance Equipment used: Rolling walker (2 wheels) Transfers: Sit to/from Stand Sit to Stand: Mod assist, +2 safety/equipment           General transfer comment: cues for hand placement as patient wanting to pull up on RW. Initially rising with min guard but with transitioning of hands required modA to maintain balance during hand transition.      Balance Overall balance assessment: Needs assistance Sitting-balance support: No upper extremity supported, Feet supported Sitting balance-Leahy Scale: Fair     Standing balance support: Bilateral upper extremity supported, Reliant on assistive device for balance Standing balance-Leahy Scale: Poor Standing balance comment: reliant on UE support  ADL either performed or assessed with clinical judgement   ADL Overall ADL's : Needs assistance/impaired Eating/Feeding: Set up;Sitting   Grooming: Set up;Sitting   Upper Body  Bathing: Set up;Sitting   Lower Body Bathing: Moderate assistance;Maximal assistance;Sitting/lateral leans;Sit to/from stand;Cueing for back precautions   Upper Body Dressing : Set up;Sitting   Lower Body Dressing: Moderate assistance;Maximal assistance;Cueing for back precautions   Toilet Transfer: Stand-pivot;Minimal assistance;Rolling walker (2 wheels)   Toileting- Clothing Manipulation and Hygiene: Min guard;Sitting/lateral lean;Sit to/from stand       Functional mobility during ADLs: Minimal assistance;Moderate assistance;Cueing for safety;Cueing for sequencing;Rolling walker (2 wheels) General ADL Comments: Patient presenting with decreased activity tolerance, pain at incision site, and need for increased assist to complete lower body ADLs     Vision Baseline Vision/History: 1 Wears glasses Ability to See in Adequate Light: 0 Adequate Patient Visual Report: No change from baseline       Perception     Praxis      Pertinent Vitals/Pain Pain Assessment Pain Assessment: Faces Faces Pain Scale: Hurts even more Pain Location: surgical site Pain Descriptors / Indicators: Aching, Sore     Hand Dominance     Extremity/Trunk Assessment Upper Extremity Assessment Upper Extremity Assessment: Overall WFL for tasks assessed   Lower Extremity Assessment Lower Extremity Assessment: Defer to PT evaluation   Cervical / Trunk Assessment Cervical / Trunk Assessment: Back Surgery   Communication Communication Communication: No difficulties   Cognition Arousal/Alertness: Awake/alert Behavior During Therapy: WFL for tasks assessed/performed Overall Cognitive Status: Within Functional Limits for tasks assessed                                 General Comments: tangential and talkative     General Comments       Exercises     Shoulder Instructions      Home Living Family/patient expects to be discharged to:: Private residence Living Arrangements:  Spouse/significant other Available Help at Discharge: Available PRN/intermittently;Family Type of Home: Apartment Home Access: Level entry     Home Layout: One level     Bathroom Shower/Tub: Teacher, early years/pre: Standard Bathroom Accessibility: Yes   Home Equipment: Cane - single point;Shower Land (2 wheels)          Prior Functioning/Environment Prior Level of Function : Independent/Modified Independent             Mobility Comments: uses cane in home when hips are bothering him, and always in the community prior to surgery. After surgery, patient utilizing RW at discharge ADLs Comments: patient reports independent prior to surgery unless hips are painful. Wife will assist as needed        OT Problem List: Decreased strength;Decreased range of motion;Decreased activity tolerance;Impaired balance (sitting and/or standing);Decreased coordination;Decreased safety awareness;Decreased knowledge of use of DME or AE;Decreased knowledge of precautions;Pain      OT Treatment/Interventions: Self-care/ADL training;Therapeutic exercise;Energy conservation;DME and/or AE instruction;Manual therapy;Therapeutic activities;Patient/family education;Balance training    OT Goals(Current goals can be found in the care plan section) Acute Rehab OT Goals Patient Stated Goal: to get home soon OT Goal Formulation: With patient Time For Goal Achievement: 12/27/21 Potential to Achieve Goals: Good ADL Goals Pt Will Perform Lower Body Bathing: Independently;sit to/from stand;sitting/lateral leans;with adaptive equipment Pt Will Perform Lower Body Dressing: Independently;with adaptive equipment;sitting/lateral leans;sit to/from stand Pt Will Transfer to Toilet: Independently;ambulating Pt Will Perform Toileting - Clothing Manipulation and  hygiene: Independently;with adaptive equipment;sitting/lateral leans;sit to/from stand Additional ADL Goal #1: Patient will  demonstrate increased activity tolerance to complete functional task in standing for 3-5 minutes without need for seated rest break to promote increased independence.  OT Frequency: Min 2X/week    Co-evaluation PT/OT/SLP Co-Evaluation/Treatment: Yes Reason for Co-Treatment: For patient/therapist safety;Complexity of the patient's impairments (multi-system involvement);To address functional/ADL transfers PT goals addressed during session: Mobility/safety with mobility;Balance;Proper use of DME OT goals addressed during session: ADL's and self-care;Proper use of Adaptive equipment and DME      AM-PAC OT "6 Clicks" Daily Activity     Outcome Measure Help from another person eating meals?: A Little Help from another person taking care of personal grooming?: A Little Help from another person toileting, which includes using toliet, bedpan, or urinal?: A Little Help from another person bathing (including washing, rinsing, drying)?: A Lot Help from another person to put on and taking off regular upper body clothing?: A Little Help from another person to put on and taking off regular lower body clothing?: A Lot 6 Click Score: 16   End of Session Equipment Utilized During Treatment: Gait belt;Rolling walker (2 wheels);Back brace Nurse Communication: Mobility status  Activity Tolerance: Patient tolerated treatment well Patient left: in bed;with call bell/phone within reach;with family/visitor present  OT Visit Diagnosis: Unsteadiness on feet (R26.81);Other abnormalities of gait and mobility (R26.89);Muscle weakness (generalized) (M62.81);Pain Pain - part of body:  (Back)                Time: 6468-0321 OT Time Calculation (min): 39 min Charges:  OT General Charges $OT Visit: 1 Visit OT Evaluation $OT Eval Moderate Complexity: 1 Mod  Corinne Ports E. Keyetta Hollingworth, OTR/L Acute Rehabilitation Services [phone]   Ascencion Dike 12/13/2021, 3:31 PM

## 2021-12-14 LAB — GLUCOSE, CAPILLARY
Glucose-Capillary: 240 mg/dL — ABNORMAL HIGH (ref 70–99)
Glucose-Capillary: 243 mg/dL — ABNORMAL HIGH (ref 70–99)
Glucose-Capillary: 274 mg/dL — ABNORMAL HIGH (ref 70–99)
Glucose-Capillary: 222 mg/dL — ABNORMAL HIGH (ref 70–99)

## 2021-12-14 NOTE — Progress Notes (Signed)
Physical Therapy Treatment Patient Details Name: Cody Berger MRN: [id number] DOB: 1966/03/03 Today's Date: 12/14/2021   History of Present Illness 56 y/o male presented to ED on 12/07/21 for increasing lower back pain and bloody discharge from lumbar wound from back surgery on 5/31. MRI showed blood in thecal sac. S/p thoracolumbar reexploration with repair of durotomy L2-3 on L and redo posterior lateral arthrodesis L1-3 on R. PMH: DM, PTSD, hx of back sx    PT Comments    Continuing work on functional mobility and activity tolerance;  session focused on monitoring activity tolerance during progressive amb, and [name] rose to the challenge; able to walk the hallways with RW and minguard assist; Cues to self-monitor for activity tolerance; Overall porgressing well and ontrack for possible dc home tomorrow   Recommendations for follow up therapy are one component of a multi-disciplinary discharge planning process, led by the attending physician.  Recommendations may be updated based on patient status, additional functional criteria and insurance authorization.  Follow Up Recommendations  Home health PT     Assistance Recommended at Discharge Frequent or constant Supervision/Assistance  Patient can return home with the following A little help with bathing/dressing/bathroom;A little help with walking and/or transfers;Assistance with cooking/housework;Assist for transportation   Equipment Recommendations  Rolling walker (2 wheels)    Recommendations for Other Services       Precautions / Restrictions Precautions Precautions: Back;Fall Precaution Comments: able to recall 2/3 back precautions from previous sx; reviewed back precautions with patient and wife Required Braces or Orthoses: Spinal Brace Spinal Brace: Thoracolumbosacral orthotic;Applied in sitting position     Mobility  Bed Mobility Overal bed mobility: Needs Assistance Bed Mobility: Sidelying to Sit, Sit to  Sidelying   Sidelying to sit: Min assist       General bed mobility comments: Cues for technqiue; Min assist to stabilize pelvis on L side as pt psuhed up from R sidelying to sit    Transfers Overall transfer level: Needs assistance Equipment used: Rolling walker (2 wheels) Transfers: Sit to/from Stand Sit to Stand: Min assist           General transfer comment: Cues for hand placement and safety; min assist to steady RW    Ambulation/Gait Ambulation/Gait assistance: Min guard (with and without physical contact) Gait Distance (Feet): 350 Feet Assistive device: Rolling walker (2 wheels)   Gait velocity: slowed     General Gait Details: slow, steady gait, requires cuing for not twisting to see what is going on in hallway; Cues to self-monitor for activity tolerance   Stairs             Wheelchair Mobility    Modified Rankin (Stroke Patients Only)       Balance     Sitting balance-Leahy Scale: Fair       Standing balance-Leahy Scale: Poor                              Cognition Arousal/Alertness: Awake/alert Behavior During Therapy: WFL for tasks assessed/performed Overall Cognitive Status: Within Functional Limits for tasks assessed                                 General Comments: tangential and talkative        Exercises      General Comments General comments (skin integrity, edema, etc.): VSS; wife present for session  Pertinent Vitals/Pain Pain Assessment Pain Assessment: 0-10 Pain Score: 4  Pain Location: surgical site Pain Descriptors / Indicators: Aching, Sore Pain Intervention(s): Repositioned    Home Living                          Prior Function            PT Goals (current goals can now be found in the care plan section) Acute Rehab PT Goals Patient Stated Goal: to go home PT Goal Formulation: With patient/family Time For Goal Achievement: 12/27/21 Potential to Achieve Goals:  Good Progress towards PT goals: Progressing toward goals    Frequency    Min 5X/week      PT Plan Current plan remains appropriate    Co-evaluation              AM-PAC PT "6 Clicks" Mobility   Outcome Measure  Help needed turning from your back to your side while in a flat bed without using bedrails?: A Little Help needed moving from lying on your back to sitting on the side of a flat bed without using bedrails?: A Lot Help needed moving to and from a bed to a chair (including a wheelchair)?: A Lot Help needed standing up from a chair using your arms (e.g., wheelchair or bedside chair)?: A Little Help needed to walk in hospital room?: A Little Help needed climbing 3-5 steps with a railing? : A Little 6 Click Score: 16    End of Session Equipment Utilized During Treatment: Back brace Activity Tolerance: Patient tolerated treatment well Patient left: in chair;with call bell/phone within reach;with family/visitor present Nurse Communication: Mobility status PT Visit Diagnosis: Muscle weakness (generalized) (M62.81);Unsteadiness on feet (R26.81);Other abnormalities of gait and mobility (R26.89) Pain - part of body:  (back)     Time: 3557-3220 PT Time Calculation (min) (ACUTE ONLY): 42 min  Charges:  $Gait Training: 23-37 mins $Therapeutic Activity: 8-22 mins                     Roney Marion, PT  Acute Rehabilitation Services Office [place] 12/14/2021, 6:20 PM

## 2021-12-14 NOTE — Progress Notes (Signed)
   Providing Compassionate, Quality Care - Together   Subjective: Patient reports expected postoperative back pain.  Objective: Vital signs in last 24 hours: Temp:  [98.2 F (36.8 C)-99.3 F (37.4 C)] 99.3 F (37.4 C) (06/11 0820) Pulse Rate:  [67-84] 84 (06/11 0820) Resp:  [13-23] 17 (06/11 0820) BP: (90-111)/(59-73) 111/59 (06/11 0820) SpO2:  [93 %-97 %] 97 % (06/11 0820)  Intake/Output from previous day: 06/10 0701 - 06/11 0700 In: 240 [P.O.:240] Out: 2150 [Urine:2150] Intake/Output this shift: No intake/output data recorded.  Alert and oriented x 4 PERRLA CN II-XII grossly intact MAE, Generalized weakness Incision is covered with Honeycomb dressing; Dressing is clean, dry, and intact   Lab Results: Recent Labs    12/13/21 0252  WBC 13.3*  HGB 12.9*  HCT 38.0*  PLT 554*   BMET Recent Labs    12/13/21 0252  NA 140  K 3.9  CL 104  CO2 26  GLUCOSE 183*  BUN 21*  CREATININE 0.81  CALCIUM 8.7*    Studies/Results: No results found.  Assessment/Plan: Patient underwent ORIF of L2 burst fx with posterior lateral fixation from T11 to L4 on 12/03/2021. He was discharged home on 12/04/2021, but developed headaches and drainage from his wound with an increase in back and leg pain. He returned to the ED on 12/07/2021 and underwent repair of durotomy at L2-3 by Dr. Ronnald Ramp on 12/10/2021. He began to mobilize yesterday, 12/13/2021. The patient feels he is gradually improving.    LOS: 7 days   -Continue to mobilize today. Hopefully, patient to discharge home tomorrow.   Viona Gilmore, DNP, AGNP-C Nurse Practitioner  [facility] [address] [place], [place], [place] [address] P: [phone]    F: [phone]  12/14/2021, 11:13 AM

## 2021-12-15 LAB — GLUCOSE, CAPILLARY: Glucose-Capillary: 194 mg/dL — ABNORMAL HIGH (ref 70–99)

## 2021-12-15 MED ORDER — OXYCODONE-ACETAMINOPHEN 7.5-325 MG PO TABS: 1.0000 | ORAL_TABLET | ORAL | 0 refills | Status: AC | PRN

## 2021-12-15 MED ORDER — METHOCARBAMOL 500 MG PO TABS: 500.0000 mg | ORAL_TABLET | Freq: Four times a day (QID) | ORAL | 0 refills | Status: AC | PRN

## 2021-12-15 NOTE — Progress Notes (Signed)
Occupational Therapy Treatment Patient Details Name: Cody Berger MRN: [id number] DOB: 19-Nov-1965 Today's Date: 12/15/2021   History of present illness 56 y/o male presented to ED on 12/07/21 for increasing lower back pain and bloody discharge from lumbar wound from back surgery on 5/31. MRI showed blood in thecal sac. S/p thoracolumbar reexploration with repair of durotomy L2-3 on L and redo posterior lateral arthrodesis L1-3 on R. PMH: DM, PTSD, hx of back sx   OT comments  Patient seated up in recliner and eager to work with OT. Patient declined getting dressed and wanted to wait till foley was removed. Patient performed mobility and standing at sink for bathing and grooming with supervision to min guard assist. Patient is motivated towards returning home with wife. Acute OT to continue to follow.    Recommendations for follow up therapy are one component of a multi-disciplinary discharge planning process, led by the attending physician.  Recommendations may be updated based on patient status, additional functional criteria and insurance authorization.    Follow Up Recommendations  Home health OT    Assistance Recommended at Discharge Intermittent Supervision/Assistance  Patient can return home with the following  A little help with walking and/or transfers;A lot of help with bathing/dressing/bathroom;Assistance with cooking/housework;Assist for transportation   Equipment Recommendations  Other (comment) (will continue to assess)    Recommendations for Other Services      Precautions / Restrictions Precautions Precautions: Back;Fall Precaution Booklet Issued: Yes (comment) Precaution Comments: able to recall 3/3 back precautions from previous sx; reviewed back precautions with patient and wife Required Braces or Orthoses: Spinal Brace Spinal Brace: Thoracolumbosacral orthotic;Applied in sitting position Restrictions Weight Bearing Restrictions: No Other Position/Activity  Restrictions: Back Precautions       Mobility Bed Mobility               General bed mobility comments: up in recliner    Transfers Overall transfer level: Needs assistance Equipment used: Rolling walker (2 wheels) Transfers: Sit to/from Stand Sit to Stand: Min assist, Min guard, From elevated surface           General transfer comment: verbal cues for safety and walker use     Balance Overall balance assessment: Needs assistance Sitting-balance support: No upper extremity supported, Feet supported Sitting balance-Leahy Scale: Fair     Standing balance support: Single extremity supported, Bilateral upper extremity supported, During functional activity Standing balance-Leahy Scale: Poor Standing balance comment: reliant on UE support                           ADL either performed or assessed with clinical judgement   ADL Overall ADL's : Needs assistance/impaired     Grooming: Wash/dry hands;Wash/dry face;Supervision/safety;Standing   Upper Body Bathing: Supervision/ safety;Standing Upper Body Bathing Details (indicate cue type and reason): performed standing at sink                           General ADL Comments: Patient declined dressing till foley is removed. performed bathing standing at sink    Extremity/Trunk Assessment              Vision       Perception     Praxis      Cognition Arousal/Alertness: Awake/alert Behavior During Therapy: WFL for tasks assessed/performed Overall Cognitive Status: Within Functional Limits for tasks assessed  General Comments: talkative and able to verbalize precautions        Exercises      Shoulder Instructions       General Comments VSS on RA    Pertinent Vitals/ Pain       Pain Assessment Pain Assessment: Faces Faces Pain Scale: Hurts a little bit Pain Location: surgical site Pain Descriptors / Indicators: Aching,  Sore Pain Intervention(s): Limited activity within patient's tolerance, Monitored during session  Home Living                                          Prior Functioning/Environment              Frequency  Min 2X/week        Progress Toward Goals  OT Goals(current goals can now be found in the care plan section)  Progress towards OT goals: Progressing toward goals  Acute Rehab OT Goals Patient Stated Goal: go home OT Goal Formulation: With patient Time For Goal Achievement: 12/27/21 Potential to Achieve Goals: Good ADL Goals Pt Will Perform Lower Body Bathing: Independently;sit to/from stand;sitting/lateral leans;with adaptive equipment Pt Will Perform Lower Body Dressing: Independently;with adaptive equipment;sitting/lateral leans;sit to/from stand Pt Will Transfer to Toilet: Independently;ambulating Pt Will Perform Toileting - Clothing Manipulation and hygiene: Independently;with adaptive equipment;sitting/lateral leans;sit to/from stand Additional ADL Goal #1: Patient will demonstrate increased activity tolerance to complete functional task in standing for 3-5 minutes without need for seated rest break to promote increased independence.  Plan Discharge plan remains appropriate    Co-evaluation                 AM-PAC OT "6 Clicks" Daily Activity     Outcome Measure   Help from another person eating meals?: A Little Help from another person taking care of personal grooming?: A Little Help from another person toileting, which includes using toliet, bedpan, or urinal?: A Little Help from another person bathing (including washing, rinsing, drying)?: A Lot Help from another person to put on and taking off regular upper body clothing?: A Little Help from another person to put on and taking off regular lower body clothing?: A Lot 6 Click Score: 16    End of Session Equipment Utilized During Treatment: Gait belt;Rolling walker (2 wheels);Back  brace  OT Visit Diagnosis: Unsteadiness on feet (R26.81);Other abnormalities of gait and mobility (R26.89);Muscle weakness (generalized) (M62.81);Pain   Activity Tolerance Patient tolerated treatment well   Patient Left in chair;with call bell/phone within reach;with family/visitor present   Nurse Communication Mobility status        Time: 1219-7588 OT Time Calculation (min): 30 min  Charges: OT General Charges $OT Visit: 1 Visit OT Treatments $Self Care/Home Management : 23-37 mins  Lodema Hong, [place]  Pager [phone] Office [place] 12/15/2021, 10:31 AM

## 2021-12-15 NOTE — TOC Transition Note (Signed)
Transition of Care [facility]) - CM/SW Discharge Note   Patient Details  Name: Cody Berger MRN: [id number] Date of Birth: [birth year]  [facility] CM/SW Contact:  Angelita Ingles, RN Phone [phone]  12/15/2021, 8:49 AM   Clinical Narrative:    Patient has discharge orders. Home health orders have been entered. CM at bedside to offer choice for home health. Patient and wife have no preference. Home health referral has been accepted by [facility] [place] with [facility].  AVS has been updated. DME previously arranged per documentation. No other needs noted at this time. TOC will sign off.    Final next level of care: [place] Barriers to Discharge: No Barriers Identified   Patient Goals and CMS Choice Patient states their goals for this hospitalization and ongoing recovery are:: Ready to go home CMS Medicare.gov Compare Post Acute Care list provided to:: Patient Choice offered to / list presented to : Patient, Spouse  Discharge Placement                       Discharge Plan and Services In-house Referral: Clinical Social Work                        HH Arranged: PT, OT [facility] Agency: [facility] Date [place]: 12/15/21 Time [place]: [phone] Representative spoke with at [place]: [place] Determinants of Health ([place]) Interventions     Readmission Risk Interventions     No data to display

## 2021-12-15 NOTE — Progress Notes (Signed)
Physical Therapy Treatment Patient Details Name: Cody Berger MRN: [id number] DOB: 1965-10-10 Today's Date: 12/15/2021   History of Present Illness 56 y/o male presented to ED on 12/07/21 for increasing lower back pain and bloody discharge from lumbar wound from back surgery on 5/31. MRI showed blood in thecal sac. S/p thoracolumbar reexploration with repair of durotomy L2-3 on L and redo posterior lateral arthrodesis L1-3 on R. PMH: DM, PTSD, hx of back sx.    PT Comments    The pt was agreeable to session focused on final education prior to anticipated d/c home today. The pt continues to demo poor power and strength in BLE to complete sit-stan transfers, and therefore session focused on sit-stand exercises from various heights of bed to challenge pt. The pt was then able to complete hallway ambulation with use of RW and minG-supervision. He had no overt LOB, verbalizes understanding of all education and is able to teach back spinal precautions. However, the pt continues to need verbal cues to maintain spinal precautions with distractions, pt and his wife educated on strategies to minimize twisting and bending with daily activities.   Gait Speed: 0.75ms using RW (Gait speed <0.636m indicates increased risk of falls and dependence in ADLs)    Recommendations for follow up therapy are one component of a multi-disciplinary discharge planning process, led by the attending physician.  Recommendations may be updated based on patient status, additional functional criteria and insurance authorization.  Follow Up Recommendations  Home health PT     Assistance Recommended at Discharge Frequent or constant Supervision/Assistance  Patient can return home with the following A little help with bathing/dressing/bathroom;A little help with walking and/or transfers;Assistance with cooking/housework;Assist for transportation   Equipment Recommendations  Rolling walker (2 wheels)    Recommendations for  Other Services       Precautions / Restrictions Precautions Precautions: Back;Fall Precaution Booklet Issued: Yes (comment) Precaution Comments: able to recall 3/3 back precautions from previous sx; reviewed back precautions with patient and wife Required Braces or Orthoses: Spinal Brace Spinal Brace: Thoracolumbosacral orthotic;Applied in sitting position Restrictions Weight Bearing Restrictions: No Other Position/Activity Restrictions: Back Precautions     Mobility  Bed Mobility Overal bed mobility: Needs Assistance Bed Mobility: Sidelying to Sit, Rolling Rolling: Min guard Sidelying to sit: Min assist       General bed mobility comments: minA to elevate trunk, cues for hand placement and technique    Transfers Overall transfer level: Needs assistance Equipment used: Rolling walker (2 wheels) Transfers: Sit to/from Stand Sit to Stand: Min assist, Min guard, From elevated surface           General transfer comment: minA from low bed, at end of session pt practices repeated sit-stand from elevated bed to mimic home set up with minG and cues for technique. increased time to rise. dependent on BUE    Ambulation/Gait Ambulation/Gait assistance: Min guard Gait Distance (Feet): 175 Feet Assistive device: Rolling walker (2 wheels) Gait Pattern/deviations: Step-through pattern, Decreased step length - right, Decreased step length - left, WFL(Within Functional Limits) Gait velocity: 0.28 m/s Gait velocity interpretation: <1.31 ft/sec, indicative of household ambulator   General Gait Details: slow, steady gait, requires cuing for not twisting during conversation with passing staff.      Balance Overall balance assessment: Needs assistance Sitting-balance support: No upper extremity supported, Feet supported Sitting balance-Leahy Scale: Fair     Standing balance support: Bilateral upper extremity supported, Reliant on assistive device for balance Standing  balance-Leahy Scale: Poor  Standing balance comment: reliant on UE support                            Cognition Arousal/Alertness: Awake/alert Behavior During Therapy: WFL for tasks assessed/performed Overall Cognitive Status: Within Functional Limits for tasks assessed                                 General Comments: tangential and talkative, but verbalizes understanding of education        Exercises Other Exercises Other Exercises: repeated sit-stand from EOB x3 from elevated height, x3 from slightly lower height    General Comments General comments (skin integrity, edema, etc.): VSS on RA      Pertinent Vitals/Pain Pain Assessment Pain Assessment: Faces Faces Pain Scale: Hurts a little bit Pain Location: surgical site Pain Descriptors / Indicators: Aching, Sore Pain Intervention(s): Limited activity within patient's tolerance, Monitored during session, Repositioned     PT Goals (current goals can now be found in the care plan section) Acute Rehab PT Goals Patient Stated Goal: to go home PT Goal Formulation: With patient/family Time For Goal Achievement: 12/27/21 Potential to Achieve Goals: Good Progress towards PT goals: Progressing toward goals    Frequency    Min 5X/week      PT Plan Current plan remains appropriate       AM-PAC PT "6 Clicks" Mobility   Outcome Measure  Help needed turning from your back to your side while in a flat bed without using bedrails?: A Little Help needed moving from lying on your back to sitting on the side of a flat bed without using bedrails?: A Little Help needed moving to and from a bed to a chair (including a wheelchair)?: A Little Help needed standing up from a chair using your arms (e.g., wheelchair or bedside chair)?: A Little Help needed to walk in hospital room?: A Little Help needed climbing 3-5 steps with a railing? : A Little 6 Click Score: 18    End of Session Equipment Utilized  During Treatment: Back brace Activity Tolerance: Patient tolerated treatment well Patient left: with family/visitor present;in bed;with call bell/phone within reach (sitting EOB) Nurse Communication: Mobility status PT Visit Diagnosis: Muscle weakness (generalized) (M62.81);Unsteadiness on feet (R26.81);Other abnormalities of gait and mobility (R26.89) Pain - part of body:  (back)     Time: 8937-3428 PT Time Calculation (min) (ACUTE ONLY): 28 min  Charges:  $Gait Training: 8-22 mins $Therapeutic Exercise: 8-22 mins                     West Carbo, PT, DPT   Acute Rehabilitation Department   Sandra Cockayne 12/15/2021, 9:02 AM

## 2021-12-15 NOTE — Discharge Summary (Signed)
Physician Discharge Summary  Patient ID: Cody Berger MRN: [id number] DOB/AGE: [id number] 58 y.o.  Admit date: 12/07/2021 Discharge date: 12/15/2021  Admission Diagnoses: spinal fluid leak    Discharge Diagnoses: same   Discharged Condition: good  Hospital Course: The patient was admitted on 12/07/2021 and taken to the operating room where the patient underwent repair of CSF leak. The patient tolerated the procedure well and was taken to the recovery room and then to the floor in stable condition. The hospital course was routine. There were no complications. The wound remained clean dry and intact. Pt had appropriate back soreness. No complaints of leg pain or new N/T/W. The patient remained afebrile with stable vital signs, and tolerated a regular diet. The patient continued to increase activities, and pain was well controlled with oral pain medications.   Consults: None  Significant Diagnostic Studies:  Results for orders placed or performed during the hospital encounter of 12/07/21  Comprehensive metabolic panel  Result Value Ref Range   Sodium 134 (L) 135 - 145 mmol/L   Potassium 3.6 3.5 - 5.1 mmol/L   Chloride 101 98 - 111 mmol/L   CO2 18 (L) 22 - 32 mmol/L   Glucose, Bld 219 (H) 70 - 99 mg/dL   BUN 15 6 - 20 mg/dL   Creatinine, Ser 0.77 0.61 - 1.24 mg/dL   Calcium 8.7 (L) 8.9 - 10.3 mg/dL   Total Protein 5.9 (L) 6.5 - 8.1 g/dL   Albumin 2.6 (L) 3.5 - 5.0 g/dL   AST 21 15 - 41 U/L   ALT 24 0 - 44 U/L   Alkaline Phosphatase 95 38 - 126 U/L   Total Bilirubin 1.2 0.3 - 1.2 mg/dL   GFR, Estimated >60 >60 mL/min   Anion gap 15 5 - 15  CBC with Differential  Result Value Ref Range   WBC 15.5 (H) 4.0 - 10.5 K/uL   RBC 3.82 (L) 4.22 - 5.81 MIL/uL   Hemoglobin 12.4 (L) 13.0 - 17.0 g/dL   HCT 35.4 (L) 39.0 - 52.0 %   MCV 92.7 80.0 - 100.0 fL   MCH 32.5 26.0 - 34.0 pg   MCHC 35.0 30.0 - 36.0 g/dL   RDW 11.9 11.5 - 15.5 %   Platelets 370 150 - 400 K/uL   nRBC 0.0 0.0 - 0.2 %    Neutrophils Relative % 86 %   Neutro Abs 13.5 (H) 1.7 - 7.7 K/uL   Lymphocytes Relative 6 %   Lymphs Abs 0.9 0.7 - 4.0 K/uL   Monocytes Relative 7 %   Monocytes Absolute 1.0 0.1 - 1.0 K/uL   Eosinophils Relative 0 %   Eosinophils Absolute 0.0 0.0 - 0.5 K/uL   Basophils Relative 0 %   Basophils Absolute 0.0 0.0 - 0.1 K/uL   Immature Granulocytes 1 %   Abs Immature Granulocytes 0.12 (H) 0.00 - 0.07 K/uL  Urinalysis, Routine w reflex microscopic Urine, In & Out Cath  Result Value Ref Range   Color, Urine YELLOW YELLOW   APPearance CLEAR CLEAR   Specific Gravity, Urine 1.023 1.005 - 1.030   pH 6.0 5.0 - 8.0   Glucose, UA >=500 (A) NEGATIVE mg/dL   Hgb urine dipstick NEGATIVE NEGATIVE   Bilirubin Urine NEGATIVE NEGATIVE   Ketones, ur 80 (A) NEGATIVE mg/dL   Protein, ur 30 (A) NEGATIVE mg/dL   Nitrite NEGATIVE NEGATIVE   Leukocytes,Ua NEGATIVE NEGATIVE   RBC / HPF 0-5 0 - 5 RBC/hpf   WBC, UA 0-5  0 - 5 WBC/hpf   Bacteria, UA NONE SEEN NONE SEEN   Squamous Epithelial / LPF 0-5 0 - 5   Mucus PRESENT   Lactic acid, plasma  Result Value Ref Range   Lactic Acid, Venous 1.6 0.5 - 1.9 mmol/L  Creatinine, serum  Result Value Ref Range   Creatinine, Ser 0.77 0.61 - 1.24 mg/dL   GFR, Estimated >60 >60 mL/min  CBC with Differential/Platelet  Result Value Ref Range   WBC 19.6 (H) 4.0 - 10.5 K/uL   RBC 4.10 (L) 4.22 - 5.81 MIL/uL   Hemoglobin 13.4 13.0 - 17.0 g/dL   HCT 37.7 (L) 39.0 - 52.0 %   MCV 92.0 80.0 - 100.0 fL   MCH 32.7 26.0 - 34.0 pg   MCHC 35.5 30.0 - 36.0 g/dL   RDW 11.9 11.5 - 15.5 %   Platelets 401 (H) 150 - 400 K/uL   nRBC 0.0 0.0 - 0.2 %   Neutrophils Relative % 91 %   Neutro Abs 18.1 (H) 1.7 - 7.7 K/uL   Lymphocytes Relative 3 %   Lymphs Abs 0.6 (L) 0.7 - 4.0 K/uL   Monocytes Relative 5 %   Monocytes Absolute 0.9 0.1 - 1.0 K/uL   Eosinophils Relative 0 %   Eosinophils Absolute 0.0 0.0 - 0.5 K/uL   Basophils Relative 0 %   Basophils Absolute 0.0 0.0 - 0.1  K/uL   Immature Granulocytes 1 %   Abs Immature Granulocytes 0.13 (H) 0.00 - 0.07 K/uL  APTT  Result Value Ref Range   aPTT 30 24 - 36 seconds  Protime-INR  Result Value Ref Range   Prothrombin Time 14.0 11.4 - 15.2 seconds   INR 1.1 0.8 - 1.2  Glucose, capillary  Result Value Ref Range   Glucose-Capillary 228 (H) 70 - 99 mg/dL  Glucose, capillary  Result Value Ref Range   Glucose-Capillary 220 (H) 70 - 99 mg/dL   Comment 1 Notify RN    Comment 2 Document in Chart   Glucose, capillary  Result Value Ref Range   Glucose-Capillary 181 (H) 70 - 99 mg/dL  Glucose, capillary  Result Value Ref Range   Glucose-Capillary 250 (H) 70 - 99 mg/dL  CBC  Result Value Ref Range   WBC 14.0 (H) 4.0 - 10.5 K/uL   RBC 3.62 (L) 4.22 - 5.81 MIL/uL   Hemoglobin 11.3 (L) 13.0 - 17.0 g/dL   HCT 33.6 (L) 39.0 - 52.0 %   MCV 92.8 80.0 - 100.0 fL   MCH 31.2 26.0 - 34.0 pg   MCHC 33.6 30.0 - 36.0 g/dL   RDW 12.2 11.5 - 15.5 %   Platelets 457 (H) 150 - 400 K/uL   nRBC 0.0 0.0 - 0.2 %  Glucose, capillary  Result Value Ref Range   Glucose-Capillary 242 (H) 70 - 99 mg/dL  Glucose, capillary  Result Value Ref Range   Glucose-Capillary 206 (H) 70 - 99 mg/dL  Glucose, capillary  Result Value Ref Range   Glucose-Capillary 193 (H) 70 - 99 mg/dL  Glucose, capillary  Result Value Ref Range   Glucose-Capillary 240 (H) 70 - 99 mg/dL   Comment 1 Notify RN    Comment 2 Document in Chart   Glucose, capillary  Result Value Ref Range   Glucose-Capillary 181 (H) 70 - 99 mg/dL   Comment 1 Notify RN    Comment 2 Document in Chart   Glucose, capillary  Result Value Ref Range   Glucose-Capillary 148 (H)  70 - 99 mg/dL   Comment 1 Notify RN    Comment 2 Document in Chart   Glucose, capillary  Result Value Ref Range   Glucose-Capillary 142 (H) 70 - 99 mg/dL  Glucose, capillary  Result Value Ref Range   Glucose-Capillary 205 (H) 70 - 99 mg/dL  CBC  Result Value Ref Range   WBC 9.2 4.0 - 10.5 K/uL   RBC  3.54 (L) 4.22 - 5.81 MIL/uL   Hemoglobin 11.3 (L) 13.0 - 17.0 g/dL   HCT 33.6 (L) 39.0 - 52.0 %   MCV 94.9 80.0 - 100.0 fL   MCH 31.9 26.0 - 34.0 pg   MCHC 33.6 30.0 - 36.0 g/dL   RDW 12.1 11.5 - 15.5 %   Platelets 482 (H) 150 - 400 K/uL   nRBC 0.0 0.0 - 0.2 %  Comprehensive metabolic panel  Result Value Ref Range   Sodium 139 135 - 145 mmol/L   Potassium 4.7 3.5 - 5.1 mmol/L   Chloride 106 98 - 111 mmol/L   CO2 21 (L) 22 - 32 mmol/L   Glucose, Bld 218 (H) 70 - 99 mg/dL   BUN 25 (H) 6 - 20 mg/dL   Creatinine, Ser 0.72 0.61 - 1.24 mg/dL   Calcium 8.4 (L) 8.9 - 10.3 mg/dL   Total Protein 5.4 (L) 6.5 - 8.1 g/dL   Albumin 2.3 (L) 3.5 - 5.0 g/dL   AST 16 15 - 41 U/L   ALT 23 0 - 44 U/L   Alkaline Phosphatase 106 38 - 126 U/L   Total Bilirubin 1.1 0.3 - 1.2 mg/dL   GFR, Estimated >60 >60 mL/min   Anion gap 12 5 - 15  Glucose, capillary  Result Value Ref Range   Glucose-Capillary 166 (H) 70 - 99 mg/dL  Glucose, capillary  Result Value Ref Range   Glucose-Capillary 221 (H) 70 - 99 mg/dL  Glucose, capillary  Result Value Ref Range   Glucose-Capillary 249 (H) 70 - 99 mg/dL  Glucose, capillary  Result Value Ref Range   Glucose-Capillary 268 (H) 70 - 99 mg/dL  Glucose, capillary  Result Value Ref Range   Glucose-Capillary 180 (H) 70 - 99 mg/dL  Glucose, capillary  Result Value Ref Range   Glucose-Capillary 234 (H) 70 - 99 mg/dL  Glucose, capillary  Result Value Ref Range   Glucose-Capillary 150 (H) 70 - 99 mg/dL  CBC  Result Value Ref Range   WBC 13.3 (H) 4.0 - 10.5 K/uL   RBC 4.10 (L) 4.22 - 5.81 MIL/uL   Hemoglobin 12.9 (L) 13.0 - 17.0 g/dL   HCT 38.0 (L) 39.0 - 52.0 %   MCV 92.7 80.0 - 100.0 fL   MCH 31.5 26.0 - 34.0 pg   MCHC 33.9 30.0 - 36.0 g/dL   RDW 12.1 11.5 - 15.5 %   Platelets 554 (H) 150 - 400 K/uL   nRBC 0.0 0.0 - 0.2 %  Basic metabolic panel  Result Value Ref Range   Sodium 140 135 - 145 mmol/L   Potassium 3.9 3.5 - 5.1 mmol/L   Chloride 104 98 - 111  mmol/L   CO2 26 22 - 32 mmol/L   Glucose, Bld 183 (H) 70 - 99 mg/dL   BUN 21 (H) 6 - 20 mg/dL   Creatinine, Ser 0.81 0.61 - 1.24 mg/dL   Calcium 8.7 (L) 8.9 - 10.3 mg/dL   GFR, Estimated >60 >60 mL/min   Anion gap 10 5 - 15  Glucose, capillary  Result Value Ref Range   Glucose-Capillary 169 (H) 70 - 99 mg/dL  Glucose, capillary  Result Value Ref Range   Glucose-Capillary 320 (H) 70 - 99 mg/dL   Comment 1 Notify RN    Comment 2 Document in Chart   Glucose, capillary  Result Value Ref Range   Glucose-Capillary 143 (H) 70 - 99 mg/dL   Comment 1 Notify RN    Comment 2 Document in Chart   Glucose, capillary  Result Value Ref Range   Glucose-Capillary 246 (H) 70 - 99 mg/dL   Comment 1 Notify RN    Comment 2 Document in Chart   Glucose, capillary  Result Value Ref Range   Glucose-Capillary 264 (H) 70 - 99 mg/dL  Glucose, capillary  Result Value Ref Range   Glucose-Capillary 240 (H) 70 - 99 mg/dL  Glucose, capillary  Result Value Ref Range   Glucose-Capillary 243 (H) 70 - 99 mg/dL   Comment 1 Notify RN    Comment 2 Document in Chart   Glucose, capillary  Result Value Ref Range   Glucose-Capillary 274 (H) 70 - 99 mg/dL   Comment 1 Notify RN    Comment 2 Document in Chart   Glucose, capillary  Result Value Ref Range   Glucose-Capillary 222 (H) 70 - 99 mg/dL  CBG monitoring, ED  Result Value Ref Range   Glucose-Capillary 252 (H) 70 - 99 mg/dL  CBG monitoring, ED  Result Value Ref Range   Glucose-Capillary 256 (H) 70 - 99 mg/dL    CT LUMBAR SPINE W CONTRAST  Result Date: 12/10/2021 CLINICAL DATA:  Previous lumbar fusion T10-L4 with decompression at L2 for treatment of T12, L2 and L4 compression fractures. Postoperative pain in the back and bilateral hips with weakness. EXAM: LUMBAR MYELOGRAM FLUOROSCOPY: 1 minute 18 seconds.  1083.85 micro gray meter squared PROCEDURE: After thorough discussion of risks and benefits of the procedure including bleeding, infection, injury to  nerves, blood vessels, adjacent structures as well as headache and CSF leak, written and oral informed consent was obtained. Consent was obtained by Dr. Nelson Chimes. Time out form was completed. Patient was positioned prone on the fluoroscopy table. Local anesthesia was provided with 1% lidocaine without epinephrine after prepped and draped in the usual sterile fashion. Puncture was performed at L5-S1 using a 3 1/2 inch 22-gauge spinal needle via right paramedian approach. Using a single pass through the dura, the needle was placed within the thecal sac, with return of clear CSF. 20 cc of Omnipaque 180 was injected into the thecal sac, with normal opacification of the nerve roots and cauda equina consistent with free flow within the subarachnoid space. I personally performed the lumbar puncture and administered the intrathecal contrast. I also personally performed acquisition of the myelogram images. TECHNIQUE: Contiguous axial images were obtained through the Lumbar spine after the intrathecal infusion of infusion. Coronal and sagittal reconstructions were obtained of the axial image sets. COMPARISON:  Multiple prior imaging studies since 11/24/2021 FINDINGS: LUMBAR MYELOGRAM FINDINGS: No compressive stenosis of the canal. Small anterior extradural defect at L2 due to retropulsed bone, but the surgical decompression as a good appearance. No evidence of hardware complication. Large CSF leak towards the left emanating from the L2-3 region. CT LUMBAR MYELOGRAM FINDINGS: Pedicle screws and posterior rods from T11 to L4, with exception of the level of the burst compression fracture at L2. Pedicle screws appear well positioned. No evidence of disconnection or hardware complication. Sufficient patency of the spinal canal throughout the  region. At the level of the L2 burst compression fracture, there is retropulsion of 5 mm, not increased. Good posterior decompression. There does not appear to be any neural compression.  There is a CSF leak emanating from the left side at the level of inferior L2 in the L2-3 disc. Contrast opacifies the fluid collection previously shown throughout the operative region of hardware placement from the T11-12 disc level down as far as the L4 hardware. Minor superior endplate fractures at E36 and L4 appear unchanged and unremarkable. IMPRESSION: Spinal fusion from T11 through L4. Posterior decompression at L2. No change in the appearance of the L2 burst fracture with retropulsion of 5 mm. Large CSF leak/dural defect on the left side at the level of inferior L2 and the L2-3 disc space, with contrast completely opacifying the fluid collection previously seen posteriorly along the hardware from T11-L4. Electronically Signed   By: Nelson Chimes M.D.   On: 12/10/2021 11:32   DG MYELOGRAPHY LUMBAR INJ LUMBOSACRAL  Result Date: 12/10/2021 CLINICAL DATA:  Previous lumbar fusion T10-L4 with decompression at L2 for treatment of T12, L2 and L4 compression fractures. Postoperative pain in the back and bilateral hips with weakness. EXAM: LUMBAR MYELOGRAM FLUOROSCOPY: 1 minute 18 seconds.  1083.85 micro gray meter squared PROCEDURE: After thorough discussion of risks and benefits of the procedure including bleeding, infection, injury to nerves, blood vessels, adjacent structures as well as headache and CSF leak, written and oral informed consent was obtained. Consent was obtained by Dr. Nelson Chimes. Time out form was completed. Patient was positioned prone on the fluoroscopy table. Local anesthesia was provided with 1% lidocaine without epinephrine after prepped and draped in the usual sterile fashion. Puncture was performed at L5-S1 using a 3 1/2 inch 22-gauge spinal needle via right paramedian approach. Using a single pass through the dura, the needle was placed within the thecal sac, with return of clear CSF. 20 cc of Omnipaque 180 was injected into the thecal sac, with normal opacification of the nerve roots and  cauda equina consistent with free flow within the subarachnoid space. I personally performed the lumbar puncture and administered the intrathecal contrast. I also personally performed acquisition of the myelogram images. TECHNIQUE: Contiguous axial images were obtained through the Lumbar spine after the intrathecal infusion of infusion. Coronal and sagittal reconstructions were obtained of the axial image sets. COMPARISON:  Multiple prior imaging studies since 11/24/2021 FINDINGS: LUMBAR MYELOGRAM FINDINGS: No compressive stenosis of the canal. Small anterior extradural defect at L2 due to retropulsed bone, but the surgical decompression as a good appearance. No evidence of hardware complication. Large CSF leak towards the left emanating from the L2-3 region. CT LUMBAR MYELOGRAM FINDINGS: Pedicle screws and posterior rods from T11 to L4, with exception of the level of the burst compression fracture at L2. Pedicle screws appear well positioned. No evidence of disconnection or hardware complication. Sufficient patency of the spinal canal throughout the region. At the level of the L2 burst compression fracture, there is retropulsion of 5 mm, not increased. Good posterior decompression. There does not appear to be any neural compression. There is a CSF leak emanating from the left side at the level of inferior L2 in the L2-3 disc. Contrast opacifies the fluid collection previously shown throughout the operative region of hardware placement from the T11-12 disc level down as far as the L4 hardware. Minor superior endplate fractures at O29 and L4 appear unchanged and unremarkable. IMPRESSION: Spinal fusion from T11 through L4. Posterior decompression at  L2. No change in the appearance of the L2 burst fracture with retropulsion of 5 mm. Large CSF leak/dural defect on the left side at the level of inferior L2 and the L2-3 disc space, with contrast completely opacifying the fluid collection previously seen posteriorly along  the hardware from T11-L4. Electronically Signed   By: Nelson Chimes M.D.   On: 12/10/2021 11:32   CT Thoracic Spine Wo Contrast  Result Date: 12/07/2021 CLINICAL DATA:  Initial evaluation for increased back pain status post recent trauma and spinal surgery. EXAM: CT THORACIC AND LUMBAR SPINE WITHOUT CONTRAST TECHNIQUE: Multidetector CT imaging of the thoracic and lumbar spine was performed without contrast. Multiplanar CT image reconstructions were also generated. RADIATION DOSE REDUCTION: This exam was performed according to the departmental dose-optimization program which includes automated exposure control, adjustment of the mA and/or kV according to patient size and/or use of iterative reconstruction technique. COMPARISON:  Comparison made with MRI performed earlier the same day as well as multiple recent examinations. FINDINGS: CT THORACIC SPINE FINDINGS Alignment: Physiologic with preservation of the normal thoracic kyphosis. No interval listhesis. Vertebrae: Acute T12 vertebral body fracture without significant height loss, grossly stable from previous. Vertebral body height maintained with no other new or acute fracture. Posterior fusion with bilateral transpedicular screws in place at T11 extending into the upper lumbar spine. Visualized portions of the hardware are intact. No periprosthetic lucency to suggest loosening. No discrete or worrisome osseous lesions. Paraspinal and other soft tissues: Scattered postoperative emphysema present within the posterior paraspinous soft tissues. Dependent atelectatic changes present within the right greater than left lungs. 1.7 cm hyperdense lesion present at the interpolar left kidney, nonspecific, but could reflect a proteinaceous and/or hemorrhagic cyst (series 7, image 163). Disc levels: No new or significant disc pathology seen within the thoracic spine. No significant stenosis. CT LUMBAR SPINE FINDINGS Segmentation: Standard. Alignment: Physiologic with  preservation of the normal lumbar lordosis. No interval listhesis or malalignment. Vertebrae: Previously identified L2 burst type compression fracture again seen, stable in appearance with stable height loss and bony retropulsion. There has been interval fracturing of the left transverse process of L2 (series 5, image 38). Acute compression fracture involving L4 also relatively stable with stable height loss without bony retropulsion. Postoperative changes from interval posterior fusion at T11 through L4, with bilateral transpedicular screws in place at T11 through L1, and L3 and L4. Hardware is intact. The transpedicular screws at L3 abut the superior endplate of L3 without frank violation of the L2-3 interspace. No significant periprosthetic lucency to suggest loosening. Paraspinal and other soft tissues: Postoperative changes noted within the posterior paraspinous soft tissues. Postoperative collection better characterized on prior MRI. Superimposed allograft material noted. Small amount of residual gas present within the ventral thecal sac at the level of L4. Aortic atherosclerosis. Large volume retained stool noted impacted within the rectal vault, suggesting constipation. Disc levels: Underlying mild degenerative spondylosis without significant spinal stenosis, better characterized on recent MRI. IMPRESSION: 1. Stable appearance of the acute L2 burst type compression fracture as well as the additional compression fractures at T12 and L4. Interval fracturing of the left transverse process of L2. 2. Postoperative changes from recent posterior fusion at T11 through L4. Hardware intact without complication. 3. Postoperative changes throughout the lower thoracolumbar spine with probable benign postoperative collection. Findings are better characterized on recent MRI. 4. Large volume retained stool impacted within the rectal vault, suggesting constipation. 5. 1.7 cm hyperdense lesion within the interpolar left  kidney, nonspecific. Follow-up  examination with nonemergent renal mass protocol CT and/or MRI suggested for further characterization. 6. Aortic Atherosclerosis (ICD10-I70.0). Electronically Signed   By: Jeannine Boga M.D.   On: 12/07/2021 23:21   CT Lumbar Spine Wo Contrast  Result Date: 12/07/2021 CLINICAL DATA:  Initial evaluation for increased back pain status post recent trauma and spinal surgery. EXAM: CT THORACIC AND LUMBAR SPINE WITHOUT CONTRAST TECHNIQUE: Multidetector CT imaging of the thoracic and lumbar spine was performed without contrast. Multiplanar CT image reconstructions were also generated. RADIATION DOSE REDUCTION: This exam was performed according to the departmental dose-optimization program which includes automated exposure control, adjustment of the mA and/or kV according to patient size and/or use of iterative reconstruction technique. COMPARISON:  Comparison made with MRI performed earlier the same day as well as multiple recent examinations. FINDINGS: CT THORACIC SPINE FINDINGS Alignment: Physiologic with preservation of the normal thoracic kyphosis. No interval listhesis. Vertebrae: Acute T12 vertebral body fracture without significant height loss, grossly stable from previous. Vertebral body height maintained with no other new or acute fracture. Posterior fusion with bilateral transpedicular screws in place at T11 extending into the upper lumbar spine. Visualized portions of the hardware are intact. No periprosthetic lucency to suggest loosening. No discrete or worrisome osseous lesions. Paraspinal and other soft tissues: Scattered postoperative emphysema present within the posterior paraspinous soft tissues. Dependent atelectatic changes present within the right greater than left lungs. 1.7 cm hyperdense lesion present at the interpolar left kidney, nonspecific, but could reflect a proteinaceous and/or hemorrhagic cyst (series 7, image 163). Disc levels: No new or significant  disc pathology seen within the thoracic spine. No significant stenosis. CT LUMBAR SPINE FINDINGS Segmentation: Standard. Alignment: Physiologic with preservation of the normal lumbar lordosis. No interval listhesis or malalignment. Vertebrae: Previously identified L2 burst type compression fracture again seen, stable in appearance with stable height loss and bony retropulsion. There has been interval fracturing of the left transverse process of L2 (series 5, image 38). Acute compression fracture involving L4 also relatively stable with stable height loss without bony retropulsion. Postoperative changes from interval posterior fusion at T11 through L4, with bilateral transpedicular screws in place at T11 through L1, and L3 and L4. Hardware is intact. The transpedicular screws at L3 abut the superior endplate of L3 without frank violation of the L2-3 interspace. No significant periprosthetic lucency to suggest loosening. Paraspinal and other soft tissues: Postoperative changes noted within the posterior paraspinous soft tissues. Postoperative collection better characterized on prior MRI. Superimposed allograft material noted. Small amount of residual gas present within the ventral thecal sac at the level of L4. Aortic atherosclerosis. Large volume retained stool noted impacted within the rectal vault, suggesting constipation. Disc levels: Underlying mild degenerative spondylosis without significant spinal stenosis, better characterized on recent MRI. IMPRESSION: 1. Stable appearance of the acute L2 burst type compression fracture as well as the additional compression fractures at T12 and L4. Interval fracturing of the left transverse process of L2. 2. Postoperative changes from recent posterior fusion at T11 through L4. Hardware intact without complication. 3. Postoperative changes throughout the lower thoracolumbar spine with probable benign postoperative collection. Findings are better characterized on recent MRI.  4. Large volume retained stool impacted within the rectal vault, suggesting constipation. 5. 1.7 cm hyperdense lesion within the interpolar left kidney, nonspecific. Follow-up examination with nonemergent renal mass protocol CT and/or MRI suggested for further characterization. 6. Aortic Atherosclerosis (ICD10-I70.0). Electronically Signed   By: Jeannine Boga M.D.   On: 12/07/2021 23:21   MR  LUMBAR SPINE WO CONTRAST  Result Date: 12/07/2021 CLINICAL DATA:  Low back pain, cauda equina syndrome suspected EXAM: MRI LUMBAR SPINE WITHOUT CONTRAST TECHNIQUE: Multiplanar, multisequence MR imaging of the lumbar spine was performed. No intravenous contrast was administered. COMPARISON:  12/02/2021 FINDINGS: Segmentation:  Standard. Alignment:  Mild levocurvature of the lumbar spine.  No listhesis. Vertebrae: Status post interval posterior fixation T11-L4. Susceptibility artifact from the hardware significantly limits evaluation at these levels. Redemonstrated compression fracture of L2, which appears unchanged from the prior exam. Redemonstrated compression deformity at the superior aspect of L4, which also appears unchanged. No new fracture. Conus medullaris and cauda equina: The conus can not be visualized because of susceptibility artifact. 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 There is a fluid fluid level in the distal thecal sac in the sacrum, likely hemorrhage, presumed to be postoperative (series 6, image 48). Air is also noted within the thecal sac at the level of L5 (series 6, image 37). Paraspinal and other soft tissues: Postsurgical changes posterior to T11-L4, with a fluid collection surrounding the posterior elements and hardware, extending from the superior edge of the field-of-view to the level of L4, measuring up to 4.6 x 9.0 x 14.4 cm. The anterior extent of the fluid collection is  difficult to discern given the susceptibility artifact. This may extend to contact the thecal sac but this is unclear. Disc levels: Unable to evaluate T11-L3. L3-L4: No spinal canal stenosis or neural foraminal narrowing. L4-L5: No significant disc bulge. No osseous spinal canal stenosis or neural foraminal narrowing. L5-S1: No significant disc bulge. No osseous spinal canal stenosis. No neural foraminal narrowing IMPRESSION: 1. Extensive interval postsurgical changes, with posterior fixation T11-L4. Susceptibility artifact from the hardware significantly limits evaluation at these levels. Within this limitation, there is an extensive fluid collection surrounding the hardware, which extends from the superior edge of the field-of-view (approximally T10-T11) through the level of L4, presumed to be postsurgical, although an infected collection cannot be excluded. 2. 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 A fluid fluid level in the distal thecal sac suggest layering hemorrhage. Air is also noted within the thecal sac at the level of L5. 3. The canal and foramina cannot be evaluated from T11 through L3. No spinal canal stenosis or neural foraminal narrowing below the level of L3-L4. These results were called by telephone at the time of interpretation on 12/07/2021 at 7:12 pm to provider [facility] , who verbally acknowledged these results. Electronically Signed   By: Merilyn Baba M.D.   On: 12/07/2021 19:14   DG THORACOLUMABAR SPINE  Result Date: 12/03/2021 CLINICAL DATA:  ORIF L2 fracture EXAM: THORACOLUMBAR SPINE 1V COMPARISON:  Lumbar spine radiographs 11/24/2021. Lumbar MRI 12/02/2021 FINDINGS: PA and lateral images lumbar spine were obtained. Moderate compression fracture of L2 noted. Pedicle screw and posterior rod fusion extending from T11 through L4. Hardware in satisfactory position without complication. IMPRESSION: Pedicle screw and rod lumbar fusion for compression fracture  of L2. Electronically Signed   By: Franchot Gallo M.D.   On: 12/03/2021 17:56   DG C-Arm 1-60 Min-No Report  Result Date: 12/03/2021 Fluoroscopy was utilized by the requesting physician.  No radiographic interpretation.   DG C-Arm 1-60 Min-No Report  Result Date: 12/03/2021 Fluoroscopy was utilized by the requesting physician.  No radiographic interpretation.   DG C-Arm 1-60 Min-No Report  Result  Date: 12/03/2021 Fluoroscopy was utilized by the requesting physician.  No radiographic interpretation.   MR THORACIC SPINE WO CONTRAST  Result Date: 12/03/2021 CLINICAL DATA:  Leg weakness, lumbar spine fracture EXAM: MRI THORACIC SPINE WITHOUT CONTRAST TECHNIQUE: Multiplanar, multisequence MR imaging of the thoracic spine was performed. No intravenous contrast was administered. COMPARISON:  None Available. FINDINGS: Alignment:  No significant listhesis. Vertebrae: T12 fracture as identified on prior imaging. Fracture line underlies the superior endplate with STIR hyperintensity but no substantial surrounding edema. Height loss is minor. Chronic mild T4 compression fracture with anterior wedging. There is also mild loss of height at additional levels with superimposed degenerative endplate irregularity and mild degenerative endplate marrow edema. Cord:  No abnormal signal. Paraspinal and other soft tissues: Unremarkable. Disc levels: Minor degenerative disc disease. For example trace disc bulges at T3-T4 and T4-T5. There is mild facet arthropathy. No canal or foraminal stenosis at any level. IMPRESSION: Likely subacute T12 fracture as identified previously. No additional acute or subacute thoracic spine fracture. Electronically Signed   By: Macy Mis M.D.   On: 12/03/2021 09:16   MR LUMBAR SPINE WO CONTRAST  Result Date: 12/02/2021 CLINICAL DATA:  L2 compression fracture and like weakness. EXAM: MRI LUMBAR SPINE WITHOUT CONTRAST TECHNIQUE: Multiplanar, multisequence MR imaging of the lumbar spine  was performed. No intravenous contrast was administered. COMPARISON:  11/24/2021 CT scan FINDINGS: Segmentation: The lowest lumbar type non-rib-bearing vertebra is labeled as L5. Alignment:  No vertebral subluxation is observed. Vertebrae: As noted on prior CT, there is an L2 burst fracture with 9 mm posterior bony retropulsion as shown on image 10 series 2, and a proximally 60% loss of vertebral body height. There is diffuse edema signal in the L2 vertebral body. In addition, there is a subtle superior endplate compression fracture at L4 with 10% loss of vertebral body heights, and marrow edema along the superior endplate compression. The fracture extends to the middle column/posterior vertebral body margin although there is no posterior bony retropulsion. In addition, there is a transverse fracture through the upper portion of the T12 vertebral body. This likewise extends to the posterior vertebral margin and accordingly involves the anterior and middle columns. There is about 10% loss of vertebral body height at T12. Conus medullaris and cauda equina: Conus extends to the L2 level. Conus and cauda equina appear normal. Paraspinal and other soft tissues: The compression fractures in the lower thoracic and lumbar spine orally associated with subtle paraspinal edema. Disc levels: T11-12: No impingement. This level is only included on the parasagittal images. T12-L1: Unremarkable L1-2: Borderline left subarticular lateral recess stenosis on image 15 series 5. At the level of maximum posterior bony retropulsion, the AP diameter of the thecal sac is 1.1 cm. L2-3: Unremarkable L3-4: Unremarkable L4-5: No impingement. Mild disc bulge. Small left foraminal annular tear. L5-S1: No impingement.  Minimal disc bulge. IMPRESSION: 1. Subacute L2 burst fracture with 9 mm posterior bony retropulsion and about 60% loss of vertebral body height. The posterior bony retropulsion is associated with equivocal left lateral recess  stenosis just below the L1-2 level. 2. Subacute superior endplate compression fracture at L4 involving the anterior and middle columns, without substantial bony retropulsion. 10% loss of vertebral height. 3. Subacute transverse upper vertebral fracture at T12 involvement of the anterior and middle columns, no retropulsion. 10% loss of vertebral height. Electronically Signed   By: Van Clines M.D.   On: 12/02/2021 21:25   CT Lumbar Spine Wo Contrast  Result Date: 11/25/2021  CLINICAL DATA:  Seizure, severe low back pain, L2 compression fracture on x-ray. EXAM: CT LUMBAR SPINE WITHOUT CONTRAST TECHNIQUE: Multidetector CT imaging of the lumbar spine was performed without intravenous contrast administration. Multiplanar CT image reconstructions were also generated. RADIATION DOSE REDUCTION: This exam was performed according to the departmental dose-optimization program which includes automated exposure control, adjustment of the mA and/or kV according to patient size and/or use of iterative reconstruction technique. COMPARISON:  No prior CT, correlation is made with lumbar spine radiographs 11/24/2021 FINDINGS: Segmentation: 5 lumbar type vertebrae. Alignment: No listhesis.  Mild levocurvature. Vertebrae: Acute burst fracture of the L2 vertebral body, which extends through the anterior, posterior, superior, and inferior cortex but does not appear to involve the posterior elements. There is approximately 65% vertebral body height loss, some of which may have been present prior to this acute fracture. 8 mm retropulsion of the posterosuperior cortex, which causes approximately 40% canal narrowing. No other acute fracture or suspicious osseous lesion. Paraspinal and other soft tissues: Knee aortic atherosclerosis. No acute finding. Disc levels: T12-L1: No significant disc bulge. No spinal canal stenosis or neural foraminal narrowing. L1-L2: Minimal disc bulge. No spinal canal stenosis at the level of the disc  space. 8 mm retropulsion of the posterosuperior cortex of L2, which causes at least mild spinal canal stenosis and narrows the lateral recesses. No neural foraminal narrowing. L2-L3: Minimal disc bulge. No spinal canal stenosis or neural foraminal narrowing. L3-L4: No significant disc bulge. No spinal canal stenosis or neural foraminal narrowing. L4-L5: Minimal disc bulge. No spinal canal stenosis or neural foraminal narrowing. L5-S1: No significant disc bulge. No spinal canal stenosis or neural foraminal narrowing. IMPRESSION: 1. Acute burst fracture of L2, which does not appear to involve the posterior elements, with 8 mm retropulsion of the posterosuperior cortex, resulting in 40% spinal canal narrowing just below the L1-L2 level. This may have been superimposed on a more chronic compression deformity. 2. No other spinal canal stenosis or neural foraminal narrowing. Electronically Signed   By: Merilyn Baba M.D.   On: 11/25/2021 00:19   CT HEAD WO CONTRAST (5MM)  Result Date: 11/24/2021 CLINICAL DATA:  New onset seizure EXAM: CT HEAD WITHOUT CONTRAST TECHNIQUE: Contiguous axial images were obtained from the base of the skull through the vertex without intravenous contrast. RADIATION DOSE REDUCTION: This exam was performed according to the departmental dose-optimization program which includes automated exposure control, adjustment of the mA and/or kV according to patient size and/or use of iterative reconstruction technique. COMPARISON:  None Available. FINDINGS: Brain: No acute infarct or hemorrhage. Lateral ventricles and midline structures are unremarkable. No acute extra-axial fluid collections. No mass effect. Vascular: No hyperdense vessel or unexpected calcification. Skull: Normal. Negative for fracture or focal lesion. Sinuses/Orbits: No acute finding. Other: None. IMPRESSION: 1. No acute intracranial process. Electronically Signed   By: Randa Ngo M.D.   On: 11/24/2021 23:04   DG Lumbar Spine  Complete  Result Date: 11/24/2021 CLINICAL DATA:  Seizure, severe low back pain. EXAM: LUMBAR SPINE - COMPLETE 4+ VIEW COMPARISON:  None Available. FINDINGS: There is an acute compression deformity with a vertical elements at L2 with loss of vertebral body height of approximately 60% a slightly retropulsed element is noted. Mild retrolisthesis at L4-L5. Intervertebral disc space is maintained in the lumbar spine. There is mild intervertebral disc space narrowing with degenerative endplate changes in the thoracic spine. Atherosclerotic calcification of the aorta is noted. IMPRESSION: Acute compression fracture at L2 with loss of vertebral  body height of approximately 60% and slightly retropulsed element. Electronically Signed   By: Brett Fairy M.D.   On: 11/24/2021 22:56    Antibiotics:  Anti-infectives (From admission, onward)    Start     Dose/Rate Route Frequency Ordered Stop   12/11/21 0000  ceFAZolin (ANCEF) IVPB 1 g/50 mL premix        1 g 100 mL/hr over 30 Minutes Intravenous Every 8 hours 12/10/21 2308 12/11/21 0934   12/10/21 2133  vancomycin (VANCOCIN) powder  Status:  Discontinued          As needed 12/10/21 2134 12/10/21 2213   12/10/21 1759  ceFAZolin (ANCEF) 2-4 GM/100ML-% IVPB       Note to Pharmacy: Gregery Na H: cabinet override      12/10/21 1759 12/11/21 0614       Discharge Exam: Blood pressure (!) 95/59, pulse 66, temperature 98.3 F (36.8 C), temperature source Oral, resp. rate 20, height '5\' 8"'$  (1.727 m), weight 70.3 kg, SpO2 97 %. Neurologic: Grossly normal Ambulating and voiding well incision cdi   Discharge Medications:   Allergies as of 12/15/2021   No Known Allergies      Medication List     TAKE these medications    Carboxymethylcellulose Sodium 0.25 % Soln Place 1 drop into both eyes 2 (two) times daily as needed (dry eyes/irritation).   cetaphil cream Apply 1 application. topically 2 (two) times daily as needed (dry skin).   clobetasol cream  0.05 % Commonly known as: TEMOVATE Apply 1 application. topically 2 (two) times daily as needed (rash).   diclofenac Sodium 1 % Gel Commonly known as: VOLTAREN Apply 1 application. topically 3 (three) times daily as needed (pain).   hydrOXYzine 50 MG capsule Commonly known as: VISTARIL Take 50 mg by mouth 2 (two) times daily as needed for anxiety.   lidocaine 5 % Commonly known as: LIDODERM Place 1 patch onto the skin 2 (two) times daily as needed (pain). Remove & Discard patch within 12 hours or as directed by MD   methocarbamol 500 MG tablet Commonly known as: ROBAXIN Take 1 tablet (500 mg total) by mouth every 6 (six) hours as needed for muscle spasms. What changed:  when to take this reasons to take this   omeprazole 20 MG capsule Commonly known as: PRILOSEC Take 40 mg by mouth every morning.   oxyCODONE-acetaminophen 5-325 MG tablet Commonly known as: Percocet Take 1 tablet by mouth every 4 (four) hours as needed for severe pain. What changed:  how much to take when to take this   oxyCODONE-acetaminophen 7.5-325 MG tablet Commonly known as: Percocet Take 1 tablet by mouth every 4 (four) hours as needed for severe pain. What changed: You were already taking a medication with the same name, and this prescription was added. Make sure you understand how and when to take each.   OZEMPIC (1 MG/DOSE) [place] Inject 1 mg into the skin every Saturday.   rizatriptan 5 MG tablet Commonly known as: MAXALT Take 5 mg by mouth See admin instructions. Take one tablet (5 mg) by mouth at onset of migraine headache, may repeat in 2 hours if needed.        Disposition: home   Final Dx: lumbar spinal fluid leak   Discharge Instructions     Call MD for:  difficulty breathing, headache or visual disturbances   Complete by: As directed    Call MD for:  hives   Complete by: As directed  Call MD for:  persistant dizziness or light-headedness   Complete by: As directed    Call MD  for:  persistant nausea and vomiting   Complete by: As directed    Call MD for:  redness, tenderness, or signs of infection (pain, swelling, redness, odor or green/yellow discharge around incision site)   Complete by: As directed    Call MD for:  severe uncontrolled pain   Complete by: As directed    Call MD for:  temperature >100.4   Complete by: As directed    Diet - low sodium heart healthy   Complete by: As directed    Driving Restrictions   Complete by: As directed    No driving for 2 weeks, no riding in the car for 1 week   Increase activity slowly   Complete by: As directed    Lifting restrictions   Complete by: As directed    No lifting more than 8 lbs   Remove dressing in 24 hours   Complete by: As directed           Signed: Ocie Cornfield Krystel Fletchall 12/15/2021, 6:59 AM

## 2021-12-22 NOTE — Anesthesia Postprocedure Evaluation (Signed)
Anesthesia Post Note  Patient: Cody Berger  Procedure(s) Performed: Exploration of ThoracoLumbar Wound, Repair of  Cerebrospinal Fluid Leak, Redo Fusion Lumbar One - Lumbar Three (Back)     Patient location during evaluation: PACU Anesthesia Type: General Level of consciousness: sedated and patient cooperative Pain management: pain level controlled Vital Signs Assessment: post-procedure vital signs reviewed and stable Respiratory status: spontaneous breathing Cardiovascular status: stable Anesthetic complications: no   No notable events documented.  Last Vitals:  Vitals:   12/15/21 0315 12/15/21 0807  BP: (!) 95/59 (!) 100/57  Pulse: 66 77  Resp: 20 18  Temp: 36.8 C 36.9 C  SpO2: 97% 96%    Last Pain:  Vitals:   12/15/21 0800  TempSrc:   PainSc: [place]

## 2022-05-12 ENCOUNTER — Other Ambulatory Visit: Payer: Self-pay | Admitting: Neurological Surgery

## 2022-05-12 DIAGNOSIS — S32021D Stable burst fracture of second lumbar vertebra, subsequent encounter for fracture with routine healing: Secondary | ICD-10-CM

## 2022-05-14 ENCOUNTER — Ambulatory Visit
Admission: RE | Admit: 2022-05-14 | Discharge: 2022-05-14 | Disposition: A | Discharge status: Home / Self Care | Payer: No Typology Code available for payment source | Source: Ambulatory Visit | Attending: Neurological Surgery | Admitting: Neurological Surgery

## 2022-05-14 DIAGNOSIS — S32021D Stable burst fracture of second lumbar vertebra, subsequent encounter for fracture with routine healing: Secondary | ICD-10-CM

## 2022-07-16 DIAGNOSIS — R569 Unspecified convulsions: Secondary | ICD-10-CM | POA: Insufficient documentation

## 2022-09-08 NOTE — Procedures (Signed)
 History:  Mr. Cody Berger is a 57 y.o. male with PMH PTSD, insomnia, tobacco use, vertigo, migraine, T2DM, HLD, HTN, OSA , presenting for diagnostic evaluation of seizure like activity. Goals of admission include spells capture and characterization.   History provided by patient and his wife and EMR personally reviewed prior to admission.   Semiology:   1) loss of time no memory of what happened for the last 5 hours  Onset: May have been since 1995 per a friend who has known him since then (Kristy)  Warning: none Duration: sec to hours Freq: several times a day Postictal: none Last: last saturday morning   2) zones out for a short amount of time and can't get his attention may come back out of it, OR if they last a longer time, he goes on autopilot like he will eat dinner and was non-verbal the whole time.  Onset: 1995 Warning: none Duration: minutes to hours Freq: could be multiple a day Postictal: drowsy, doze off Last: past Saturday     3) convulsive seizure- laid down for a nap on the couch. He said he didn't feel good before he laid down for a nap, wife heard a primal scream, eyes were rolled back/white, arms were stiff and by his side, legs were stiff, drooling.  Onset: May 2023 Warning: none Duration: lasted for several minutes.  Freq: one time Postictal: Sleeping/confused after.   Freq: once only in May 2023.    Meds: Topamax 100 mg  once daily  Start Review: 09/07/2022 from 10:52 End Review:09/09/2022 at 10:30   Post processing with an event detection algorithm was applied to the continuous data collection for the purpose of identifying abnormal epochs.  These detections are reviewed and all candidate seizures were processed for further analysis.  Video & Data was reviewed in its raw format and patient event alerts were processed, edited and stored on the [employer] database.     This is a routine 21-channel EEG recording with one channel dedicated to a limited EKG lead.   Electrodes were placed in accordance with the International 10-20 System.    Day 1: 09/07/2022 from 10:52 until 3/5/20024 at 08:00  Background  EEG : Posterior dominant rhythm consisted of a frequency of 10 Hz with amplitudes of  20 -35 uV.  This was symmetric over the bilateral posterior derivations and attenuated with eye opening.  There was a well-formed anterior-posterior gradient.  There were no asymmetries.  Sleep:With drowsiness there was some waxing and waning of the dominant rhythm with eventual replacement by a mixture of beta, alpha and theta activity.  As the patient entered stage II of sleep, symmetrical spindles and vertex sharp waves were present.  Arousal was unremarkable.  Photic stimulation: Driving but no epileptiform activity.    There were no pushbutton events no seizures were captured.  Interpretation: This is a normal awake and asleep EEG.   Clinical correlation: A normal EEG does not rule out a seizure disorder.  There were no pushbutton events during the segment of the recording.  Clinical correlation is required.  Emaline GORMAN Fowler, MD  Day 2: 09/08/2022 from 08:00 until 3/6/20024 at 10:30  Background  EEG : Posterior dominant rhythm consisted of a frequency of 10 Hz with amplitudes of  20 -35 uV.  This was symmetric over the bilateral posterior derivations and attenuated with eye opening.  There was a well-formed anterior-posterior gradient.  There were no asymmetries.  Sleep:With drowsiness there was some waxing and waning of  the dominant rhythm with eventual replacement by a mixture of beta, alpha and theta activity.  As the patient entered stage II of sleep, symmetrical spindles and vertex sharp waves were present.  Arousal was unremarkable.  Photic stimulation: Driving but no epileptiform activity.    There were no pushbutton events no seizures were captured.  Interpretation: This is a normal awake and asleep EEG.   Clinical correlation: A normal EEG does  not rule out a seizure disorder.  There were no pushbutton events during the segment of the recording.  Clinical correlation is required.  Emaline GORMAN Fowler, MD

## 2022-09-08 NOTE — Procedures (Signed)
 Note in error.

## 2022-09-09 NOTE — Discharge Summary (Signed)
 Neurology Discharge Summary  Patient ID: Cody Berger  [id number]  57 y.o.  05-09-1966  Admitting Information: Admitting Service:  Neurology Epilepsy Admit date: 09/07/2022  Admission Diagnosis/Symptoms:  seizure like activity Admitting Physician: Emaline GORMAN Fowler, MD   Indication for Admission: spells capture and characterization  Discharge Summary: Discharge Service: Neurology Epilepsy [212] Discharge Date: 09/09/2022   Discharge Physician: Emaline Fowler  Primary Discharge Diagnosis: seizure like activity Secondary Discharge Diagnosis/Complications/Co-Morbidities:  PTSD, insomnia, tobacco use, vertigo, migraine, OSA   Discharged Condition: stable Discharge planning discussed in huddle including: attending, APP, epilepsy nurse coordinator, EMU nursing staff. Patient participated in Discharge planning.  Hospital Course Cody Berger is  57 y.o. male with  PTSD, insomnia, tobacco use, vertigo, migraine, T2DM, HLD, HTN, OSA, admitted to the [facility] Epilepsy Monitoring Unit for further evaluation and management of spells. For full record of presenting illness including complete description of spells, relevant past / social / family history, please see the H&P from this encounter, dated 09/07/2022.   Baseline EEG was performed followed by continuous video EEG during the entirety of this admission.   Despite provocating procedures including stress, sleep deprivation, photic stimulation and other methods, we were unable to record a stereotypic clinical spell. Patient is unable to tell whether he had any typical spells or not, there is no seizures on EEG throughout EMU stay. Patient will be discharged with unchanged topamax 100 mg daily, as patient also getsbenefit of migraine control of it. Follow up has been arranged for further medication titration. We appreciate the opportunity to participate in the care of this patient.  Relevant hospital course: No notes on  file  Discharge ASMs: Topamax 100 mg daily  Principal Problem:   Seizure-like activity (HCC) Resolved Problems:   * No resolved hospital problems. *    Discharge Examination: Neuro: Alert and oriented to person, place and situation. Language: fluent and prosodic. Content and vocabulary reasonable for age and education level. Attention and Concentration intact, World spelled backward. CN II: PERRLA CN III, IV, VI: EOM intact, no end-gaze nystagmus CN V: facial sensation to light touch intact bilaterally CN VII: smiling and tight eye closure symmetric CN VIII: grossly intact CN IX, X: palate elevates symmetrically CN XII: tongue midline, no fasciculations or atrophy Motor: Deltoids, biceps, triceps, interossei, hip flexor, quadriceps 5/5 bilaterally Pronator drift absent, no tremor DTR: biceps +2 bilaterally, patellar +2 bilaterally Coordination: Finger to nose without tremor  Consults:  none  Patient's Ordered Code Status: Full Code  Significant Diagnostic Studies:  CVEEG  Treatments:  ASM  Disposition: home  Goals of Care: spells were not captured.  Physician/Clinic Follow-Up Appointments Follow-up with Dr. Marsa in 4 months.  Recommended Labs/Studies after Discharge: none  Patient Instructions:  - continue topamax 100 mg daily. - call if seizures or concerns. - follow up with Dr.Alexander in July/2024.  If you were pleased by the care you received, please fill out the survey you will receive either via mail or email. We want to be your 10! If you feel you would need to score us  8 or below, please let us  know how we can improve your care experience.  Discharge Medications:   Medication List     CONTINUE taking these medications    calcium  carbonate 1250 mg (500 mg calcium ) tablet Commonly known as: OS-CAL   Cetaphil Crea Generic drug: cetaphil   cholecalciferol  1,000 unit (25 mcg) tablet Commonly known as: VITAMIN D3   clobetasoL 0.05 %  cream  Commonly known as: TEMOVATE   divalproex  500 mg extended release tablet Commonly known as: DEPAKOTE    hydrOXYzine  50 mg capsule Commonly known as: VISTARIL    Latuda  tablet Generic drug: lurasidone    lidocaine  5 % patch Commonly known as: LIDODERM    methocarbamoL  500 mg tablet Commonly known as: ROBAXIN    metoprolol  tartrate 25 mg tablet Commonly known as: LOPRESSOR    omeprazole 20 mg DR capsule Commonly known as: PriLOSEC   oxyCODONE -acetaminophen  5-325 mg per tablet Commonly known as: PERCOCET   Ozempic 1 mg/dose (4 mg/3 mL) Pnij Generic drug: semaglutide   rizatriptan 5 mg tablet Commonly known as: MAXALT   rosuvastatin 40 mg tablet Commonly known as: CRESTOR   TheraTears 0.25 % Drop Generic drug: carboxymethylcellulose sodium   topiramate 100 mg tablet Commonly known as: TOPAMAX         Discharge Orders     Full Code     Lifting Limits:     Details:    Lifting Limits: No lifting limits        Follow-up Appointments: Appointments which have been scheduled for you    Sep 18, 2022 1:30 PM Office Visit with Shelly Charity, MD [facility] [place] ([facility] [place]) [address] [place] [address] [phone]  Please arrive 15 minutes prior to your scheduled visit.     Feb 01, 2023 2:00 PM Transfer with Arlena KATHEE Blunt, MD [facility] [phone] GENERAL NEUROLOGY [facility]) [facility] [place] [address] [phone]        Electronically signed by: Russella Paddock, NP 09/09/2022 9:59 AM  Time spent on discharge: 30 minutes.  I reviewed the notes, discussed the case  and examined the patient along with  nurse practitioner Ms. Fredric Paddock NP.  I agree with the plan of care.This is a shared visit with her.  I independently reviewed the patient's records, obtained history, examined  the patient and formulated the plan of care.  Given 45 minutes were spent in coordinating care for this patient.  No epileptiform activity noted.  No spells captured.  No seizures were identified.  The patient is being discharged home with follow-up with his primary neurologist on an outpatient basis.

## 2022-10-29 NOTE — Progress Notes (Signed)
  PAIN PLATELET RICH PROTEIN INJECTION  Performed by: Shelly Charity, MD Authorized by: Shelly Charity, MD   Procedure:  Pain Platelet Rich Protein Injection Comments:     PRP injection: 0232T.2 - bilateral   [facility]  PATIENT NAME: Cody Berger PATIENT MRN:  [id number] DATE OF SURGERY: 10/29/2022   TITLE OF PROCEDURE:  1) Posterior Sacroiliac Ligament Complex Platelet Rich PlasmaInjection      2) Fluoroscopic Guidance    SIDE: Bilateral  PREOPERATIVE DIAGNOSES: Sacroiliac Joint Dysfunction, chronic sacroiliac ligamentous complex mediated pain  INDICATION and PHYSICAL EXAM: Carlin Dallas Ciancio's pain is sacroiliac ligament mediated disease which was confirmed by provocative testing today.  POSTOPERATIVE DIAGNOSES:  Same  LOCATION: [facility]  SURGEON: Shelly Charity MD  ASSISTANTS: None  ANESTHESIA:  Local  DESCRIPTION OF OPERATIVE PROCEDURE:  Informed consent was obtained. The patient was brought to the Procedure Room and they positioned themselves to their comfort and optimal positioning for procedure.  Time-out was conducted with all members of the care team.  Standard ASA monitors were placed. Standard prep with chloraprep and drape were performed.  The veteran's full social security number and date of birth was confirmed. Next,  whole blood was drawn by nursing in a sterile manner. Whole blood was transferred to the centrifuge system. After automated centrifuge by the centrifuge  system, platelet rich plasma was separated. There was approximately 8mL of PRP separated.   Fluoroscopic views were obtained of the sacroiliac joint region. The area overlying the ligamentous complex was identified with contralateral oblique fluoroscopy. Next, a 23-gauge 3.5 inch spinal needle was then guided into the target zones of sacroiliac ligament complex  (superior and inferior portions of the ligament) under  fluoroscopic guidance. Final position was confirmed in multiplanar views. The following solution was then injected into each site located in the superior and inferior aspect of the sacroiliac ligament: 2ml of PRP to the inferior aspect and 2ml of PRP to the superior aspect.. The procedure was repeated on the opposite side. There was a total of 8ml used for the procedure. The surgical site preparation was washed off of the patient. Band-Aids were applied. The patient was brought to the recovery area.  The patient did very well and the procedure results were discussed.  Standard discharge instructions were given to the patient.  The patient knows how to contact the clinic should they have any questions or problems.  Our clinic nurse will call the patient for follow-up tomorrow.  Preprocedural pain score was  8/10 Post procedural pain score was 6/10  COMPLICATIONS: None  EBL: minimal  URINE OUTPUT: not monitored  FLUIDS: none  PLAN: Will plan for repeat PRP injection in 6 weeks.

## 2022-11-02 ENCOUNTER — Other Ambulatory Visit (HOSPITAL_COMMUNITY): Payer: Self-pay | Admitting: Neurological Surgery

## 2022-11-02 DIAGNOSIS — S32021D Stable burst fracture of second lumbar vertebra, subsequent encounter for fracture with routine healing: Secondary | ICD-10-CM

## 2023-02-08 DIAGNOSIS — I159 Secondary hypertension, unspecified: Secondary | ICD-10-CM | POA: Diagnosis present

## 2023-02-08 DIAGNOSIS — Z720 Tobacco use: Secondary | ICD-10-CM | POA: Diagnosis present

## 2023-04-13 ENCOUNTER — Emergency Department (HOSPITAL_COMMUNITY)
Admission: EM | Admit: 2023-04-13 | Discharge: 2023-04-13 | Disposition: A | Discharge status: Home / Self Care | Payer: No Typology Code available for payment source | Attending: Emergency Medicine | Admitting: Emergency Medicine

## 2023-04-13 ENCOUNTER — Emergency Department (HOSPITAL_COMMUNITY): Payer: No Typology Code available for payment source

## 2023-04-13 ENCOUNTER — Other Ambulatory Visit: Payer: Self-pay

## 2023-04-13 DIAGNOSIS — R0781 Pleurodynia: Secondary | ICD-10-CM | POA: Insufficient documentation

## 2023-04-13 DIAGNOSIS — S32021D Stable burst fracture of second lumbar vertebra, subsequent encounter for fracture with routine healing: Secondary | ICD-10-CM

## 2023-04-13 DIAGNOSIS — S32021A Stable burst fracture of second lumbar vertebra, initial encounter for closed fracture: Secondary | ICD-10-CM | POA: Insufficient documentation

## 2023-04-13 DIAGNOSIS — W19XXXA Unspecified fall, initial encounter: Secondary | ICD-10-CM | POA: Insufficient documentation

## 2023-04-13 DIAGNOSIS — R519 Headache, unspecified: Secondary | ICD-10-CM | POA: Diagnosis not present

## 2023-04-13 DIAGNOSIS — S79912A Unspecified injury of left hip, initial encounter: Secondary | ICD-10-CM | POA: Diagnosis present
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
## 2023-04-13 NOTE — ED Triage Notes (Signed)
Patient had mechanical fall in his driveway while walking to the car. He landed partially in the car and partially out. Head, neck, L knee, L rib cage pain. Denies LOC.
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
## 2024-01-25 NOTE — Progress Notes (Signed)
 [facility]   [facility] Established Patient Note    Date of Service: 01/25/2024        Patient Name: Cody Berger      Date of Birth: 1965/11/06      MRN: [id number]       Primary Care Physician: No Pcp       Main Pain Anesthesiologist: Shelly Charity, MD Main Pain APP: Alana Search, PA-C Last Office visit: 09/28/2023 with Shelly Charity, MD Last Procedure Visit: 01/17/2024 with Shelly Charity, MD for cluneal nerve blocks   Chief Complaint:  Chief Complaint  Patient presents with  . Leg Pain    Bilateral     History of Present Illness:  Cody Berger is a 58 y.o. old male.   Pain Assessment Pain Score  : 2  What number best describes your pain on average in the past week?: 8 What number best describes how, during the past week, pain has interfered with your enjoyment of life?: 8 What number best describes how, during the past week, pain has interfered with your general activity?: 8 PEG Pain Total Score: 8  Pain Description: Since the last visit the pain is improved in the back. The most significant location of pain is the bilateral leg. The 1-2 words that best describe the pain: tightening The pain improves with laying down. The pain is made worse with prolonged walking and sitting. The average daily pain score is 0/10. The pain can be as high as 8/10 at its worst. The pain interferes with: Walking and Sitting.   Therapies: Did the patient have an injection/procedure since the last visit? Yes.  01/17/24 - (1) Superior cluneal nerve block (2) Medial cluneal nerve block by Dr. Charity 01/03/24 - Lumbar RFA (Right) 12/13/23 - Lumbar RFA (Left)             - Percentage of pain relief from injection/procedure: 80%   Has the patient had any new imaging (X-ray, MRI, CT) for pain concerns since the last visit? No Has physical therapy been initiated or completed for this pain concern? Yes - Where was physical  therapy completed? At an outside clinic (list clinic name and[facility]or [facility] Has a home exercise program (directed by a physical therapist or medical provider) been completed for this pain concern? Yes   Medications: Was a new medication for pain started by our clinic at the last visit? No Is the patient on a blood thinner (including aspirin, Goody/BC/Bayer powder)? No  I personally reviewed and confirmed the above information with the patient.  Electronically signed by: Alana Search, PA-C 01/25/2024    Review of Systems:  ROS was performed with positive/negative pertinent findings listed in HPI.  Electronically signed by: Alana Search, PA-C 01/25/2024  Past Medical History: Medical History[1]    Past Surgical History: Surgical History[2]   Family History: Family History[3]   Social History: Social History   Tobacco Use  . Smoking status: Every Day    Current packs/day: 1.00    Average packs/day: 1 pack/day for 40.6 years (40.6 ttl pk-yrs)    Types: Cigarettes    Start date: 51  . Smokeless tobacco: Former  Substance Use Topics  . Alcohol  use: Yes    Allergies  Allergen Reactions  . Sumatriptan  Other (See Comments)    Current Medications: Current Medications[4]  ____________________________________________________________________  Physical Exam:   Vitals: Vitals:   01/25/24 0812  BP: 103/66  Pulse: 67  Resp: 16  SpO2: 97%    Constitutional: Well developed, Well nourished, No acute distress and Interactive General: Patient is alert and orientated Head: normocephalic and atraumatic  Respiratory: non labored breathing  Psychological: The patient's mood is normal, and appropriate for the circumstances.  Neuro/Musculoskeletal Exam:  Spine: The spine is straight.   Gait:  antalgic Uses assist device - single point cane  ______________________________________________  Controlled Substance Monitoring:  NCPDMP:  Reviewed today  CTE  Measures: Is the patient taking prescription opioids? No __________________________________________________________________   Labs: Relevant Labs Reviewed: Below labs reviewed, CBC, and BMP/CMP/Cr  Lab Results  Component Value Date   CREATININE 1.17 09/07/2022    Lab Results  Component Value Date   WBC 7.50 09/07/2022   HGB 14.9 09/07/2022   HCT 43.0 09/07/2022   MCV 94.7 09/07/2022   PLT 285 09/07/2022    Lab Results  Component Value Date   HGBA1C 7.4 (H) 04/11/2017     No results found for: AMPHUR, BARBUR, BENUNUM, BENZUR, COCAINEUR, OPIATEUR, THCUR  --------------------------------------------------------------------------------------------------------------------  Diagnostic Studies:  All applicable diagnostic imaging related to this evaluation have been reviewed.   CT LUMBAR SPINE WITHOUT CONTRAST  05/14/2022  TECHNIQUE:  Multidetector CT imaging of the lumbar spine was performed without  intravenous contrast administration. Multiplanar CT image  reconstructions were also generated.   RADIATION DOSE REDUCTION: This exam was performed according to the  departmental dose-optimization program which includes automated  exposure control, adjustment of the mA and/or kV according to  patient size and/or use of iterative reconstruction technique.   COMPARISON:  CT lumbar myelogram 12/10/2021.   FINDINGS:  Segmentation: Normal, the same numbering system used on the  comparison.   Alignment: Stable.  Overall mild straightening of lumbar lordosis.   Vertebrae: Postoperative details are below. Mild T12 and moderate L2  chronic compression fractures with stable chronic L2 retropulsion.  Mild chronic L4 superior endplate compression is stable.   However, new bilateral L4 posterior element fractures since June.  See additional details at that level below.   No other new osseous abnormality. Visible sacrum and SI joints  appear intact.   Paraspinal  and other soft tissues: Stable postoperative changes to  the posterior paraspinal soft tissues. Normal caliber abdominal  aorta. Aortoiliac calcified atherosclerosis. Other visible  noncontrast abdominal viscera appear negative.   Disc levels:   T12-L1: Bilateral T12 and L1 pedicle screws appear intact with no  adverse features. Evidence of developing bilateral posterior element  arthrodesis. No stenosis.   L1-L2: Disc space loss and disc osteophyte complex in large part due  to L2 retropulsion appears stable. Previous posterior decompression  of this level. No significant stenosis when comparing to the  previous myelogram. However, there are dystrophic calcifications  associated with the thecal sac and/or nerve roots suspected now on  series 4, image 37.   L2-L3: Posterior decompression on the left. Mild disc bulging here.  L2 retropulsion is primarily above this endplate level. No  significant stenosis.   L3-L4: Bilateral L3 and L4 pedicle screws appear intact. However,  there are relatively nondisplaced fractures in the bilateral L4  posterior elements associated with the course of both screws, pars  type fractures seen on sagittal images 33 through 50 and extend  across the bilateral L4 lamina (sagittal image 35).   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 But overall  no solid arthrodesis. No stenosis is evident.   L4-L5:  L4 pedicle screws and posterior element fractures are  detailed above. No L5 hardware. Mild disc bulging and posterior  element hypertrophy. Mild left L4 neural foraminal stenosis.   L5-S1: Mild facet hypertrophy. Capacious spinal canal. No stenosis.  But small dystrophic appearing calcifications within the thecal sac  posteriorly at S1 on the right (series 4, image 97).   IMPRESSION:  1. Development of nondisplaced bilateral L4 pars and posterior  element fractures in  association with the pedicle screws since June.  No associated spondylolisthesis. No convincing hardware loosening.  Stable mild chronic L4 compression fracture. 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   2. T12-L1 fusion with evidence of posterior element arthrodesis.  Stable mild T12 compression.   3. Stable L2 compression fracture with retropulsion and prior  decompression.   4. Small dystrophic calcifications in the thecal sac at both the L2  and S2 levels. Perhaps this is posttraumatic. Significance is  unclear, consider arachnoiditis.   5.  Aortic Atherosclerosis (ICD10-I70.0).  ___________________________________________________________________     Diagnosis:  1. History of lumbar spinal fusion (Primary)  2. Chronic myofascial pain  3. Spondylosis of lumbar joint   A/P 01/25/2024:  Cody Berger is a 58 y.o. male being evaluated for continued management of his chronic pain.  He appears to be in no acute physical distress.  He is sitting comfortably in the exam room.  He is very jovial on today's visit.  He returns for follow-up on his chronic lower back and buttock pains.  He has a history of thoracolumbar spinal fusion spanning from T11-L4.  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  He has underwent multiple bouts of platelet rich plasma injections in the lower back and sacroiliac region.  He cites benefit with that procedure but limited as far as time.  He has since underwent multiple other interventional therapies most recently including radiofrequency ablations of the L4-S1 facet nerves and is in the process of moving forward with the superior cluneal nerve blocks to RFA.  He states that the procedure has been working well.  He cites a notable improvement in his lower back pain.  He reports about 80% relief thus far.  He is scheduled to undergo the diagnostic confirmatory diagnostic testing of the  superior and cluneal nerve blocks.  He reports his pain today is a 2/10 on numerical scale.  He says this is not usual.  He feels that he is having a really good day today.  He says sometimes his pain is worse in the early mornings or whenever he is laying still for a long time but as he gets going and getting more activity it tends to get a little better.  At this time he will continue with the treatment regimen as set forth.  He had no other concerns at the moment.  He will follow-up accordingly.   ____________________________________________________________________  Recommended Plan of Care:  - Interventional Procedures: - Prior: TPI on 05/08/2022 by Dr. Constantino ~ 50% relief reported on 05/15/2022 - Prior: TPI on 05/15/2022 by Dr. Constantino  - PRP (bilat posterior sacroiliac ligament complex) on 07/09/2022 by Dr. Constantino ~ 80% relief reported on 08/07/2022 - Prior: TPI on 09/11/2022 & 09/17/2022 by Dr. Constantino  - Prior: PRP (bilat posterior sacroiliac ligament complex) on 10/29/2022 by Dr. Constantino - Prior: PRP (bilat posterior sacroiliac ligament complex) on 12/17/2022 by Dr. Constantino - Prior: PRP (bilat posterior sacroiliac ligament complex) on  02/04/2023 by Dr. Constantino - Prior: TPI on 08/27/2023 by Dr. Constantino ~ 60% relief reported on 09/28/2023 - Prior: Bilat L4-S1 MBB on 10/18/2023 & 11/15/2023 by Dr. Constantino - Prior: L4-S1 RFA on 12/13/2023 (left) & 01/03/2024 (right) by Dr. Constantino ~ 80% relief reported on 01/25/2024 - Prior: Bilat Superior/medial cluneal nerve blocks #1/2 on 01/17/2024 by Dr. Constantino ~80% reported on 01/25/2024 - Pending: Bilat Superior/medial cluneal nerve blocks #2/2  - Medical Modalities:  - no changes  - Imaging/Labs:  - none ordered  - Consults/Therapy:  - HEP as tolerated  - Follow Up: - No follow-ups on file.   Treatment plan fully discussed and agreed upon with patient. All questions were answered. The patient was told to contact our clinic with  questions/concerns and to report to the Emergency Room if any concerning symptoms develop.  The patient expressed understanding and all questions were answered.  Given the patient's complex chronic pain symptoms and medical history, the patient will require continued follow-up in a longitudinal fashion.   Medical decision making for this patient was moderately complex given the independent interpretation of imaging and lab results, review of relevant records from other healthcare providers,  the severity/progression of their condition, and the risk of complication and/or morbidity that may exist with or without treatment.   Time (>30 minutes) was spent on reviewing patient PMH, medications, procedures and image, performing medically appropriate examination, and counseling and educating patient.  Documentation on day of service.  Electronically signed by: Alana Search, PA-C 01/25/2024       [1] Past Medical History: Diagnosis Date  . Diabetes    (CMD)   . GERD (gastroesophageal reflux disease)   . Ludwig's angina   . Migraines   [2] Past Surgical History: Procedure Laterality Date  . EPIDURAL BLOCK INJECTION Bilateral 10/18/2023   BLOCK/INJECTION MEDIAL BRANCH LUMBAR FACET BILAT L4-S1 #1/2 performed by Shelly Constantino, MD at [facility] PREM ASC OR  . EPIDURAL BLOCK INJECTION Bilateral 11/15/2023   BLOCK/INJECTION MEDIAL BRANCH LUMBAR FACET  BILATERAL L4-S1 #2/2 performed by Shelly Constantino, MD at [facility] PREM [facility] OR  . INCISION AND DRAINAGE OF WOUND N/A 04/09/2017   Procedure: NECK INCISION & DRAINAGE neck;  Surgeon: Bard Jakie Ill, MD;  Location: [facility] MAIN OR;  Service: ENT;  Laterality: N/A;  . INCISION AND DRAINAGE OF WOUND N/A 04/12/2017   Procedure: NECK INCISION & DRAINAGE;  Surgeon: Fonda Laraine Muscat, MD;  Location: [facility] MAIN OR;  Service: ENT;  Laterality: N/A;  . MULTIDAY/LONG-TERM/CONTINUOUS EEG  09/08/2022  . OTHER SURGICAL HISTORY     Procedure: OTHER SURGICAL HISTORY  (incision and drainge of submental abscess)  . RHIZOTOMY W/ RADIOFREQUENCY ABLATION Left 12/13/2023   RHIZOTOMY FACET RADIOFREQUENCY  LUMBAR RFA  LEFT L4-S1 #1/2 performed by Shelly Constantino, MD at [facility] PREM ASC OR  . RHIZOTOMY W/ RADIOFREQUENCY ABLATION Right 01/03/2024   RHIZOTOMY FACET RADIOFREQUENCY  LUMBAR RFA   RIGHT L4-S1 #2/2 performed by Shelly Constantino, MD at [facility] PREM [facility] OR  . TOOTH EXTRACTION N/A 04/12/2017   Procedure: DENTAL EXTRACTIONS;  Surgeon: Rudell Theoplis Cory, DMD;  Location: [facility] MAIN OR;  Service: Dentistry;  Laterality: N/A;  . TRACHEOSTOMY N/A 04/09/2017   Procedure: TRACHEOTOMY;  Surgeon: Bard Jakie Ill, MD;  Location: [facility] MAIN OR;  Service: ENT;  Laterality: N/A;  . TRIGGER POINT INJECTION Bilateral 01/17/2024   BLOCK/INJECTION SELECTIVE NERVE/NERVE ROOT DIAGNOSTIC/THEREAPEUTIC - #1. Superior cluneal nerve block, #2. Medial cluneal nerve block performed by Shelly Constantino, MD at [facility]  PREM ASC OR  [3] Family History Problem Relation Name Age of Onset  . Epilepsy Sister         onset in childhood, grand mal and petite mal  [4] Current Outpatient Medications  Medication Sig Dispense Refill  . acetaminophen  (TYLENOL ) 500 mg tablet Take 500 mg by mouth.    . calcium  carbonate (OS-CAL) 1250 mg (500 mg calcium ) tablet     . carboxymethylcellulose sodium (TheraTears) 0.25 % drop Administer 1 drop into affected eye(s).    . Cetaphil crea APPLY MODERATE AMOUNT TO AFFECTED AREA DAILY APPLY THROUGHOUT THE ENTIRE BODY DAILY, THIS IS NOT A MEDICATION, IT IS  JUST A DAILY MOISTURIZER TO USE. APPLY THROUGHOUT THE ENTIRE BODY DAILY, THIS IS NOT A MEDICATION, IT IS   JUST A DAILY MOISTURIZER TO USE.    . cetirizine (ZyrTEC) 10 mg tablet Take 10 mg by mouth.    . cholecalciferol  (VITAMIN D3) 1,000 unit (25 mcg) tablet Take 3 tablets by mouth daily.    . clobetasoL (TEMOVATE) 0.05 % cream Apply  topically.    . divalproex  (DEPAKOTE ) 500 mg extended release tablet Take 2,000 mg by mouth  daily.    . hydrOXYzine  (VISTARIL ) 50 mg capsule Take 50 mg by mouth.    SABRA ibuprofen (MOTRIN) 200 mg tablet Take 200 mg by mouth.    . lidocaine  (LIDODERM ) 5 % patch 1 patch.    . methocarbamoL  (ROBAXIN ) 500 mg tablet 500 mg.    . omeprazole (PriLOSEC) 20 mg DR capsule Take 40 mg by mouth daily.    . rizatriptan (MAXALT) 5 mg tablet Take 5 mg by mouth.    . rosuvastatin (CRESTOR) 40 mg tablet 20 mg.    . semaglutide (Ozempic) 1 mg/dose (4 mg/3 mL) pnij     . topiramate (TOPAMAX) 100 mg tablet 100 mg.     No current facility-administered medications for this visit.

## 2024-05-03 ENCOUNTER — Other Ambulatory Visit: Payer: Self-pay

## 2024-05-03 ENCOUNTER — Emergency Department (HOSPITAL_COMMUNITY)

## 2024-05-03 ENCOUNTER — Encounter (HOSPITAL_COMMUNITY): Payer: Self-pay

## 2024-05-03 ENCOUNTER — Observation Stay (HOSPITAL_COMMUNITY)
Admission: EM | Admit: 2024-05-03 | Discharge: 2024-05-04 | Disposition: A | Discharge status: Home / Self Care | Attending: Internal Medicine | Admitting: Internal Medicine

## 2024-05-03 DIAGNOSIS — R55 Syncope and collapse: Secondary | ICD-10-CM | POA: Diagnosis present

## 2024-05-03 DIAGNOSIS — R0902 Hypoxemia: Secondary | ICD-10-CM | POA: Insufficient documentation

## 2024-05-03 DIAGNOSIS — I159 Secondary hypertension, unspecified: Secondary | ICD-10-CM | POA: Diagnosis not present

## 2024-05-03 DIAGNOSIS — E119 Type 2 diabetes mellitus without complications: Secondary | ICD-10-CM | POA: Diagnosis not present

## 2024-05-03 DIAGNOSIS — G43909 Migraine, unspecified, not intractable, without status migrainosus: Secondary | ICD-10-CM | POA: Insufficient documentation

## 2024-05-03 DIAGNOSIS — G3184 Mild cognitive impairment, so stated: Secondary | ICD-10-CM | POA: Insufficient documentation

## 2024-05-03 DIAGNOSIS — H9319 Tinnitus, unspecified ear: Secondary | ICD-10-CM | POA: Insufficient documentation

## 2024-05-03 DIAGNOSIS — J45909 Unspecified asthma, uncomplicated: Secondary | ICD-10-CM | POA: Diagnosis not present

## 2024-05-03 DIAGNOSIS — F1090 Alcohol use, unspecified, uncomplicated: Secondary | ICD-10-CM | POA: Insufficient documentation

## 2024-05-03 DIAGNOSIS — R739 Hyperglycemia, unspecified: Secondary | ICD-10-CM | POA: Insufficient documentation

## 2024-05-03 DIAGNOSIS — R0683 Snoring: Secondary | ICD-10-CM | POA: Insufficient documentation

## 2024-05-03 DIAGNOSIS — R404 Transient alteration of awareness: Secondary | ICD-10-CM | POA: Insufficient documentation

## 2024-05-03 DIAGNOSIS — F17210 Nicotine dependence, cigarettes, uncomplicated: Secondary | ICD-10-CM | POA: Diagnosis not present

## 2024-05-03 DIAGNOSIS — G4710 Hypersomnia, unspecified: Secondary | ICD-10-CM | POA: Insufficient documentation

## 2024-05-03 DIAGNOSIS — Z720 Tobacco use: Secondary | ICD-10-CM | POA: Diagnosis present

## 2024-05-03 DIAGNOSIS — E782 Mixed hyperlipidemia: Secondary | ICD-10-CM | POA: Diagnosis not present

## 2024-05-03 DIAGNOSIS — R569 Unspecified convulsions: Principal | ICD-10-CM | POA: Insufficient documentation

## 2024-05-03 DIAGNOSIS — R42 Dizziness and giddiness: Secondary | ICD-10-CM | POA: Insufficient documentation

## 2024-05-03 DIAGNOSIS — K219 Gastro-esophageal reflux disease without esophagitis: Secondary | ICD-10-CM | POA: Insufficient documentation

## 2024-05-03 DIAGNOSIS — E010 Iodine-deficiency related diffuse (endemic) goiter: Secondary | ICD-10-CM | POA: Insufficient documentation

## 2024-05-03 DIAGNOSIS — Z79899 Other long term (current) drug therapy: Secondary | ICD-10-CM | POA: Insufficient documentation

## 2024-05-03 DIAGNOSIS — Z981 Arthrodesis status: Secondary | ICD-10-CM

## 2024-05-03 DIAGNOSIS — K0530 Chronic periodontitis, unspecified: Secondary | ICD-10-CM | POA: Insufficient documentation

## 2024-05-03 LAB — ETHANOL: Alcohol, Ethyl (B): <15 mg/dL (ref <15)

## 2024-05-03 LAB — COMPREHENSIVE METABOLIC PANEL WITH GFR
ALT: 37 U/L (ref 0–44)
AST: 26 U/L (ref 15–41)
Albumin: 3.9 g/dL (ref 3.5–5.0)
Alkaline Phosphatase: 88 U/L (ref 38–126)
Anion gap: 9 (ref 5–15)
BUN: 25 mg/dL — ABNORMAL HIGH (ref 6–20)
CO2: 25 mmol/L (ref 22–32)
Calcium: 9.1 mg/dL (ref 8.9–10.3)
Chloride: 104 mmol/L (ref 98–111)
Creatinine, Ser: 1.14 mg/dL (ref 0.61–1.24)
GFR, Estimated: >60 mL/min (ref >60)
Glucose, Bld: 263 mg/dL — ABNORMAL HIGH (ref 70–99)
Potassium: 4 mmol/L (ref 3.5–5.1)
Sodium: 138 mmol/L (ref 135–145)
Total Bilirubin: 0.5 mg/dL (ref 0.0–1.2)
Total Protein: 6.2 g/dL — ABNORMAL LOW (ref 6.5–8.1)

## 2024-05-03 LAB — URINALYSIS, W/ REFLEX TO CULTURE (INFECTION SUSPECTED)
Bacteria, UA: NONE SEEN
Bilirubin Urine: NEGATIVE
Glucose, UA: 150 mg/dL — AB
Hgb urine dipstick: NEGATIVE
Ketones, ur: 5 mg/dL — AB
Leukocytes,Ua: NEGATIVE
Nitrite: NEGATIVE
Protein, ur: NEGATIVE mg/dL
Specific Gravity, Urine: 1.015 (ref 1.005–1.030)
pH: 5 (ref 5.0–8.0)

## 2024-05-03 LAB — I-STAT CHEM 8, ED
BUN: 24 mg/dL — ABNORMAL HIGH (ref 6–20)
Calcium, Ion: 1.2 mmol/L (ref 1.15–1.40)
Chloride: 103 mmol/L (ref 98–111)
Creatinine, Ser: 1.1 mg/dL (ref 0.61–1.24)
Glucose, Bld: 255 mg/dL — ABNORMAL HIGH (ref 70–99)
HCT: 38 % — ABNORMAL LOW (ref 39.0–52.0)
Hemoglobin: 12.9 g/dL — ABNORMAL LOW (ref 13.0–17.0)
Potassium: 3.9 mmol/L (ref 3.5–5.1)
Sodium: 139 mmol/L (ref 135–145)
TCO2: 23 mmol/L (ref 22–32)

## 2024-05-03 LAB — HEMOGLOBIN A1C
Hgb A1c MFr Bld: 8.2 % — ABNORMAL HIGH (ref 4.8–5.6)
Mean Plasma Glucose: 188.64 mg/dL

## 2024-05-03 LAB — URINE DRUG SCREEN
Amphetamines: NEGATIVE
Barbiturates: NEGATIVE
Benzodiazepines: NEGATIVE
Cocaine: NEGATIVE
Fentanyl: POSITIVE — AB
Methadone Scn, Ur: NEGATIVE
Opiates: POSITIVE — AB
Tetrahydrocannabinol: NEGATIVE

## 2024-05-03 LAB — CBC WITH DIFFERENTIAL/PLATELET
Abs Immature Granulocytes: 0.17 K/uL — ABNORMAL HIGH (ref 0.00–0.07)
Basophils Absolute: 0.1 K/uL (ref 0.0–0.1)
Basophils Relative: 0 %
Eosinophils Absolute: 0 K/uL (ref 0.0–0.5)
Eosinophils Relative: 0 %
HCT: 38.4 % — ABNORMAL LOW (ref 39.0–52.0)
Hemoglobin: 13 g/dL (ref 13.0–17.0)
Immature Granulocytes: 1 %
Lymphocytes Relative: 8 %
Lymphs Abs: 1.1 K/uL (ref 0.7–4.0)
MCH: 31.5 pg (ref 26.0–34.0)
MCHC: 33.9 g/dL (ref 30.0–36.0)
MCV: 93 fL (ref 80.0–100.0)
Monocytes Absolute: 0.7 K/uL (ref 0.1–1.0)
Monocytes Relative: 5 %
Neutro Abs: 12.4 K/uL — ABNORMAL HIGH (ref 1.7–7.7)
Neutrophils Relative %: 86 %
Platelets: 181 K/uL (ref 150–400)
RBC: 4.13 MIL/uL — ABNORMAL LOW (ref 4.22–5.81)
RDW: 12.2 % (ref 11.5–15.5)
WBC: 14.5 K/uL — ABNORMAL HIGH (ref 4.0–10.5)
nRBC: 0 % (ref 0.0–0.2)

## 2024-05-03 LAB — GLUCOSE, CAPILLARY: Glucose-Capillary: 268 mg/dL — ABNORMAL HIGH (ref 70–99)

## 2024-05-03 LAB — I-STAT CG4 LACTIC ACID, ED
Lactic Acid, Venous: 1.5 mmol/L (ref 0.5–1.9)
Lactic Acid, Venous: 12.6 mmol/L (ref 0.5–1.9)
Lactic Acid, Venous: 2.6 mmol/L (ref 0.5–1.9)

## 2024-05-03 LAB — CBG MONITORING, ED: Glucose-Capillary: 230 mg/dL — ABNORMAL HIGH (ref 70–99)

## 2024-05-03 LAB — TROPONIN T, HIGH SENSITIVITY
Troponin T High Sensitivity: 16 ng/L (ref 0–19)
Troponin T High Sensitivity: 22 ng/L — ABNORMAL HIGH (ref 0–19)

## 2024-05-03 LAB — VALPROIC ACID LEVEL: Valproic Acid Lvl: <10 ug/mL — ABNORMAL LOW (ref 50–100)

## 2024-05-03 MED ORDER — DIAZEPAM 5 MG/ML IJ SOLN
5.0000 mg | INTRAMUSCULAR | Status: DC | PRN
Start: 2024-05-03 — End: 2024-05-04

## 2024-05-03 MED ORDER — VALPROATE SODIUM 100 MG/ML IV SOLN
1250.0000 mg | Freq: Once | INTRAVENOUS | Status: AC
  Administered 2024-05-03: 1250 mg via INTRAVENOUS
  Filled 2024-05-03: qty 12.5

## 2024-05-03 MED ORDER — MORPHINE SULFATE (PF) 4 MG/ML IV SOLN: 4.0000 mg | Freq: Once | INTRAVENOUS | Status: DC

## 2024-05-03 MED ORDER — HYDROXYZINE HCL 25 MG PO TABS: 25.0000 mg | ORAL_TABLET | Freq: Two times a day (BID) | ORAL | Status: DC | PRN

## 2024-05-03 MED ORDER — SODIUM CHLORIDE 0.9 % IV BOLUS
1000.0000 mL | Freq: Once | INTRAVENOUS | Status: AC
  Administered 2024-05-03: 1000 mL via INTRAVENOUS

## 2024-05-03 MED ORDER — ONDANSETRON HCL 4 MG/2ML IJ SOLN: 4.0000 mg | Freq: Four times a day (QID) | INTRAMUSCULAR | Status: DC | PRN

## 2024-05-03 MED ORDER — INSULIN ASPART 100 UNIT/ML IJ SOLN
3.0000 [IU] | Freq: Once | INTRAMUSCULAR | Status: AC
  Administered 2024-05-03: 3 [IU] via SUBCUTANEOUS

## 2024-05-03 MED ORDER — PANTOPRAZOLE SODIUM 40 MG PO TBEC
40.0000 mg | DELAYED_RELEASE_TABLET | Freq: Every day | ORAL | Status: DC
  Administered 2024-05-03 – 2024-05-04 (×2): 40 mg via ORAL
  Filled 2024-05-03 (×2): qty 1

## 2024-05-03 MED ORDER — METHOCARBAMOL 500 MG PO TABS
500.0000 mg | ORAL_TABLET | Freq: Four times a day (QID) | ORAL | Status: DC | PRN
  Administered 2024-05-03 – 2024-05-04 (×2): 500 mg via ORAL
  Filled 2024-05-03 (×2): qty 1

## 2024-05-03 MED ORDER — ONDANSETRON HCL 4 MG PO TABS: 4.0000 mg | ORAL_TABLET | Freq: Four times a day (QID) | ORAL | Status: DC | PRN

## 2024-05-03 MED ORDER — INSULIN ASPART 100 UNIT/ML IJ SOLN
0.0000 [IU] | Freq: Three times a day (TID) | INTRAMUSCULAR | Status: DC
  Administered 2024-05-04: 3 [IU] via SUBCUTANEOUS
  Administered 2024-05-04: 5 [IU] via SUBCUTANEOUS

## 2024-05-03 MED ORDER — ACETAMINOPHEN 325 MG PO TABS: 650.0000 mg | ORAL_TABLET | Freq: Four times a day (QID) | ORAL | Status: DC | PRN

## 2024-05-03 MED ORDER — SODIUM CHLORIDE 0.45 % IV SOLN: INTRAVENOUS | Status: DC

## 2024-05-03 MED ORDER — MORPHINE SULFATE (PF) 4 MG/ML IV SOLN
4.0000 mg | Freq: Once | INTRAVENOUS | Status: AC
  Administered 2024-05-03: 4 mg via INTRAVENOUS
  Filled 2024-05-03: qty 1

## 2024-05-03 MED ORDER — VENLAFAXINE HCL ER 75 MG PO CP24
75.0000 mg | ORAL_CAPSULE | Freq: Every day | ORAL | Status: DC
  Administered 2024-05-04: 75 mg via ORAL
  Filled 2024-05-03: qty 1

## 2024-05-03 MED ORDER — LORATADINE 10 MG PO TABS
10.0000 mg | ORAL_TABLET | Freq: Every day | ORAL | Status: DC
  Administered 2024-05-03 – 2024-05-04 (×2): 10 mg via ORAL
  Filled 2024-05-03 (×2): qty 1

## 2024-05-03 MED ORDER — MORPHINE SULFATE (PF) 4 MG/ML IV SOLN
4.0000 mg | INTRAVENOUS | Status: DC | PRN
  Administered 2024-05-03: 4 mg via INTRAVENOUS
  Filled 2024-05-03: qty 1

## 2024-05-03 MED ORDER — OXYCODONE HCL 5 MG PO TABS
5.0000 mg | ORAL_TABLET | Freq: Four times a day (QID) | ORAL | Status: DC | PRN
  Administered 2024-05-04: 5 mg via ORAL
  Filled 2024-05-03: qty 1

## 2024-05-03 MED ORDER — HYDROXYZINE PAMOATE 25 MG PO CAPS: 25.0000 mg | ORAL_CAPSULE | Freq: Two times a day (BID) | ORAL | Status: DC | PRN

## 2024-05-03 MED ORDER — ROSUVASTATIN CALCIUM 10 MG PO TABS
10.0000 mg | ORAL_TABLET | Freq: Every day | ORAL | Status: DC
  Administered 2024-05-03 – 2024-05-04 (×2): 10 mg via ORAL
  Filled 2024-05-03 (×2): qty 1

## 2024-05-03 MED ORDER — ACETAMINOPHEN 650 MG RE SUPP: 650.0000 mg | Freq: Four times a day (QID) | RECTAL | Status: DC | PRN

## 2024-05-03 MED ORDER — ENOXAPARIN SODIUM 40 MG/0.4ML IJ SOSY
40.0000 mg | PREFILLED_SYRINGE | INTRAMUSCULAR | Status: DC
  Administered 2024-05-03: 40 mg via SUBCUTANEOUS
  Filled 2024-05-03: qty 0.4

## 2024-05-03 MED ORDER — LEVETIRACETAM (KEPPRA) 500 MG/5 ML ADULT IV PUSH: 1500.0000 mg | Freq: Once | INTRAVENOUS | Status: DC

## 2024-05-03 MED ORDER — METOPROLOL TARTRATE 25 MG PO TABS
12.5000 mg | ORAL_TABLET | Freq: Two times a day (BID) | ORAL | Status: DC
  Administered 2024-05-03 – 2024-05-04 (×2): 12.5 mg via ORAL
  Filled 2024-05-03 (×2): qty 1

## 2024-05-03 MED ORDER — KETOROLAC TROMETHAMINE 30 MG/ML IJ SOLN
30.0000 mg | Freq: Four times a day (QID) | INTRAMUSCULAR | Status: AC
  Administered 2024-05-03 (×2): 30 mg via INTRAVENOUS
  Filled 2024-05-03 (×2): qty 1

## 2024-05-03 MED ORDER — TOPIRAMATE 100 MG PO TABS
100.0000 mg | ORAL_TABLET | Freq: Every day | ORAL | Status: DC
  Administered 2024-05-04: 100 mg via ORAL
  Filled 2024-05-03 (×2): qty 1

## 2024-05-03 NOTE — Progress Notes (Signed)
 Pt unable to urinate upon arrival to unit. Bladder scanner reads . Order obtained for I&O cath. I&O cath performed and returned of yellow urine. Urine send for UA ordered.

## 2024-05-03 NOTE — ED Notes (Signed)
 Pt to CT

## 2024-05-03 NOTE — H&P (Signed)
 History and Physical    Patient: Cody Berger FMW:[id number] DOB: 15-May-1966 DOA: 05/03/2024 DOS: the patient was seen and examined on 05/03/2024 PCP: [facility]  Patient coming from: Home  Chief Complaint:  Chief Complaint  Patient presents with   Loss of Consciousness   Back Pain   Out of Seizure Medication    HPI: Cody Berger is a 58 y.o. male with medical history significant of type 2 diabetes, alcohol  use, tobacco use, chronic periodontitis, asthma, GERD, hyperlipidemia, secondary hypertension, CSF leak, lumbar compression fracture, status post lumbar fusion, necrotizing fasciitis, basal cell carcinoma of the face, OSA, polyarthralgia, PTSD, schizoaffective disorder depressive type, tardive dyskinesia, migraine headaches, seizure disorder who has not been taking his antiepileptic medications and presented to the emergency department BIBEMS after he fell off his toilet hitting his head while in the bathroom at home.  This has exacerbated the patient's chronic back pain.  He is currently somnolent after receiving valproic acid and unable to elaborate further.  No headache, chest or abdominal pain, has severe mid and lower back pain.  Lab work: CBC showed a white count of 14.5, hemoglobin 13.0 g/dL and platelets 818.  Alcohol  level was normal.  CMP showed a total protein was 6.2, glucose of 263 and BUN of 25 mg/dL.  The rest of the CMP measurements were normal.  Troponin was 16 then 22 ng/L.  Lactic acid was 1.5 then 12.6 and 2.6 mmol/L.  Valproic acid was less than 10 mcg/mL.  Imaging: Portable 1 view chest radiograph with no active disease.  Pelvis x-ray no acute fracture or dislocation, but positive cytopenia.  CT head without contrast no acute brain injury.  CT cervical spine no acute cervical spine injury.  There was mild spondylosis with mild disc disease at C5-C6 level.  Minimal left-sided neuroforaminal narrowing at the C4-C5 level and right-sided  neuroforaminal narrowing at C5-C6 level.  CT lumbar spine with no acute findings.  Stable L2 compression fracture.  Stable posterior fusion hardware from T11-L4.  Mild spondylosis of the lumbar spine.   ED course: Initial vital signs were temperature 97.7 F, pulse 56, respiration 20, BP 91/63 mmHg and O2 sat 99% on room air.  The patient received 2000 mL of normal saline bolus, morphine 4 mg IVP, valproic acid 450 mg IVPB and Topamax 100 mg p.o. daily after discussing with neurology.  Review of Systems: As mentioned in the history of present illness. All other systems reviewed and are negative. Past Medical History:  Diagnosis Date   Diabetes mellitus without complication (HCC)    Skin cancer    Past Surgical History:  Procedure Laterality Date   BACK SURGERY     DENTAL SURGERY  04/12/2017   14 teeth removed   DENTAL SURGERY  08/2017   removed remaining 9 teeth   IRRIGATION AND DEBRIDEMENT ABSCESS  04/09/2017   neck - necrotizing fasciitis   LAMINECTOMY WITH POSTERIOR LATERAL ARTHRODESIS LEVEL 4 N/A 12/03/2021   Procedure: Open reduction internal fixation lumbar two fracture, instrumented fusion Thoracic Eleven-Lumbar Four;  Surgeon: Joshua Alm RAMAN, MD;  Location: [facility] OR;  Service: Neurosurgery;  Laterality: N/A;   LUMBAR LAMINECTOMY/DECOMPRESSION MICRODISCECTOMY N/A 12/10/2021   Procedure: Exploration of ThoracoLumbar Wound, Repair of  Cerebrospinal Fluid Leak, Redo Fusion Lumbar One - Lumbar Three;  Surgeon: Joshua Alm RAMAN, MD;  Location: [facility] OR;  Service: Neurosurgery;  Laterality: N/A;   Social History:  reports that he has been smoking cigarettes. He has never used smokeless tobacco.  He reports current alcohol  use of about 1.0 standard drink of alcohol  per week. He reports that he does not use drugs.  No Known Allergies  History reviewed. No pertinent family history.  Prior to Admission medications   Medication Sig Start Date End Date Taking? Authorizing Provider   Carboxymethylcellulose Sodium 0.25 % SOLN Place 1 drop into both eyes 2 (two) times daily as needed (dry eyes/irritation).    [provider]  clobetasol cream (TEMOVATE) 0.05 % Apply 1 application. topically 2 (two) times daily as needed (rash).    [provider]  diclofenac Sodium (VOLTAREN) 1 % GEL Apply 1 application. topically 3 (three) times daily as needed (pain).    [provider]  Emollient (CETAPHIL) cream Apply 1 application. topically 2 (two) times daily as needed (dry skin).    [provider]  hydrOXYzine  (VISTARIL ) 50 MG capsule Take 50 mg by mouth 2 (two) times daily as needed for anxiety.    [provider]  lidocaine  (LIDODERM ) 5 % Place 1 patch onto the skin 2 (two) times daily as needed (pain). Remove & Discard patch within 12 hours or as directed by MD    [provider]  methocarbamol  (ROBAXIN ) 500 MG tablet Take 1 tablet (500 mg total) by mouth every 6 (six) hours as needed for muscle spasms. 12/15/21   Meyran, Suzen Lacks, NP  omeprazole (PRILOSEC) 20 MG capsule Take 40 mg by mouth every morning.    [provider]  rizatriptan (MAXALT) 5 MG tablet Take 5 mg by mouth See admin instructions. Take one tablet (5 mg) by mouth at onset of migraine headache, may repeat in 2 hours if needed.    [provider]  Semaglutide (OZEMPIC, 1 MG/DOSE, [place]) Inject 1 mg into the skin every Saturday.    [provider]    Physical Exam: Vitals:   05/03/24 1242 05/03/24 1249 05/03/24 1427  BP:  91/63 111/66  Pulse:  (!) 56 95  Resp:  20 16  Temp:  97.7 F (36.5 C) 97.6 F (36.4 C)  TempSrc:  Oral Axillary  SpO2:  99% 94%  Weight: 68 kg    Height: 5' 8 (1.727 m)     Physical Exam Vitals and nursing note reviewed.  Constitutional:      Appearance: He is ill-appearing.  HENT:     Head: Normocephalic.     Nose: No rhinorrhea.  Eyes:     General: No scleral icterus.    Pupils: Pupils are equal,  round, and reactive to light.  Cardiovascular:     Rate and Rhythm: Normal rate and regular rhythm.  Pulmonary:     Effort: Pulmonary effort is normal.     Breath sounds: No wheezing, rhonchi or rales.  Abdominal:     General: Bowel sounds are normal. There is no distension.     Palpations: Abdomen is soft.     Tenderness: There is no abdominal tenderness. There is no right CVA tenderness or left CVA tenderness.  Musculoskeletal:     Cervical back: Neck supple.     Right lower leg: No edema.     Left lower leg: No edema.  Skin:    General: Skin is warm and dry.  Neurological:     General: No focal deficit present.     Mental Status: He is alert and oriented to person, place, and time.  Psychiatric:        Mood and Affect: Mood normal.  Behavior: Behavior normal.     Data Reviewed:  Results are pending, will review when available.  Assessment and Plan: Principal Problem:   Seizures (HCC) Observation/PCU. Frequent neurochecks. Seizure precautions. Loaded with valproic acid 250 mg. Continue topiramate 100 mg p.o. daily. Diazepam  as needed for seizures. Follow-up with neurology as an outpatient.  Active Problems:   S/P lumbar fusion Continue Robaxin  100 mg every 6 hours as needed. Ketorolac  30 mg IVP x 2 doses tonight. Oxycodone  5 mg every 6 hours as needed. Morphine 4 mg IVP every 3 hours as needed for severe pain.    Diabetes (HCC) Associated with:   Hyperglycemia Carbohydrate modified diet. CBG monitoring with RI SS. Check hemoglobin A1c.    Asthma without status asthmaticus No symptoms at this time. Bronchodilators as needed.    GERD (gastroesophageal reflux disease) Continue omeprazole or formulary equivalent daily.    Mixed hyperlipidemia Continue rosuvastatin 10 mg p.o. daily.    Migraines Rizatriptan as needed.    Secondary hypertension Continue metoprolol  12.5 mg p.o. twice daily.    Tobacco use Tobacco cessation might be  beneficial. If needed nicotine replacement therapy may be ordered.    Advance Care Planning:   Code Status: Full Code   Consults:   Family Communication:   Severity of Illness: The appropriate patient status for this patient is OBSERVATION. Observation status is judged to be reasonable and necessary in order to provide the required intensity of service to ensure the patient's safety. The patient's presenting symptoms, physical exam findings, and initial radiographic and laboratory data in the context of their medical condition is felt to place them at decreased risk for further clinical deterioration. Furthermore, it is anticipated that the patient will be medically stable for discharge from the hospital within 2 midnights of admission.   Author: Alm Dorn Castor, MD 05/03/2024 3:53 PM  For on call review [email or web address].   This document was prepared using Dragon voice recognition software and may contain some unintended transcription errors.

## 2024-05-03 NOTE — ED Notes (Signed)
 Pt to xray

## 2024-05-03 NOTE — ED Triage Notes (Signed)
 Pt BIB ems for falling off his toilet and hitting his head somewhere in the bathroom. Pt states having 10/10 back pain. Not on blood thinners. Hx of seizures, out of his seizure medication. Given 150 ml NS and 100mcg of fentanyl  by ems. A&Ox4

## 2024-05-03 NOTE — ED Notes (Signed)
 X-ray at bedside.

## 2024-05-03 NOTE — ED Provider Notes (Signed)
 [place] EMERGENCY DEPARTMENT AT [facility] Provider Note   CSN: [id number] Arrival date & time: 05/03/24  1221     Patient presents with: Loss of Consciousness, Back Pain, and Out of Seizure Medication    Akhilesh Sassone is a 58 y.o. male.   58 year old male with prior medical history as detailed below presents for evaluation.  Patient reports that he passed out or fell down while in his bathroom.  He did hit his head.  He complains of low back pain.  He complains of chronic low back pain.  Patient is not very forthcoming with details leading to his being on the floor of his bathroom.  Most questions are answered with I do not know.  EMS administered 250 mL of normal saline and 100 mcg of fentanyl  during transport.  Prior medical history includes PTSD, insomnia, tobacco use, vertigo, migraine, T2DM, HLD, HTN, OSA.    Additional history obtained from patient's significant other.  She reports that he has been out of his Topamax for the last week.  The history is provided by the patient and medical records.       Prior to Admission medications   Medication Sig Start Date End Date Taking? Authorizing Provider  Carboxymethylcellulose Sodium 0.25 % SOLN Place 1 drop into both eyes 2 (two) times daily as needed (dry eyes/irritation).    [provider]  clobetasol cream (TEMOVATE) 0.05 % Apply 1 application. topically 2 (two) times daily as needed (rash).    [provider]  diclofenac Sodium (VOLTAREN) 1 % GEL Apply 1 application. topically 3 (three) times daily as needed (pain).    [provider]  Emollient (CETAPHIL) cream Apply 1 application. topically 2 (two) times daily as needed (dry skin).    [provider]  hydrOXYzine  (VISTARIL ) 50 MG capsule Take 50 mg by mouth 2 (two) times daily as needed for anxiety.    [provider]  lidocaine  (LIDODERM ) 5 % Place 1 patch onto the skin 2 (two) times daily as needed  (pain). Remove & Discard patch within 12 hours or as directed by MD    [provider]  methocarbamol  (ROBAXIN ) 500 MG tablet Take 1 tablet (500 mg total) by mouth every 6 (six) hours as needed for muscle spasms. 12/15/21   Meyran, Suzen Lacks, NP  omeprazole (PRILOSEC) 20 MG capsule Take 40 mg by mouth every morning.    [provider]  rizatriptan (MAXALT) 5 MG tablet Take 5 mg by mouth See admin instructions. Take one tablet (5 mg) by mouth at onset of migraine headache, may repeat in 2 hours if needed.    [provider]  Semaglutide (OZEMPIC, 1 MG/DOSE, [place]) Inject 1 mg into the skin every Saturday.    [provider]    Allergies: Patient has no known allergies.    Review of Systems  All other systems reviewed and are negative.   Updated Vital Signs Ht 5' 8 (1.727 m)   Wt 68 kg   BMI 22.81 kg/m   Physical Exam Vitals and nursing note reviewed.  Constitutional:      General: He is not in acute distress.    Appearance: He is well-developed.  HENT:     Head: Normocephalic and atraumatic.  Eyes:     Conjunctiva/sclera: Conjunctivae normal.  Cardiovascular:     Rate and Rhythm: Normal rate and regular rhythm.     Heart sounds: No murmur heard. Pulmonary:     Effort: Pulmonary  effort is normal. No respiratory distress.     Breath sounds: Normal breath sounds.  Abdominal:     Palpations: Abdomen is soft.     Tenderness: There is no abdominal tenderness.  Musculoskeletal:        General: No swelling.     Cervical back: Normal range of motion and neck supple.     Comments: Complains of diffuse tenderness to the low lumbar back.  Skin:    General: Skin is warm and dry.     Capillary Refill: Capillary refill takes less than 2 seconds.  Neurological:     General: No focal deficit present.     Mental Status: He is alert and oriented to person, place, and time.     Cranial Nerves: No cranial nerve deficit.     Motor: No weakness.   Psychiatric:        Mood and Affect: Mood normal.     (all labs ordered are listed, but only abnormal results are displayed) Labs Reviewed  CBC WITH DIFFERENTIAL/PLATELET  ETHANOL  COMPREHENSIVE METABOLIC PANEL WITH GFR  URINE DRUG SCREEN  URINALYSIS, W/ REFLEX TO CULTURE (INFECTION SUSPECTED)  I-STAT CHEM 8, ED  I-STAT CG4 LACTIC ACID, ED  CBG MONITORING, ED  TROPONIN T, HIGH SENSITIVITY    EKG: None  Radiology: No results found.   Procedures   Medications Ordered in the ED - No data to display  Clinical Course as of 05/03/24 1536  Wed May 03, 2024  1500 Patient went to CT and was mentating well while complaining of low back pain.  Patient was brought back from CT.  In between being brought to the room after CT and when the nurse checked on him after imaging the patient may have had a seizure.  I was asked to evaluate the patient.  Patient appears to be postictal.  He is somnolent, but withdraws to painful stimulation.  He was not on the monitor at this time.  No one witnessed any seizure activity.  Patient with apparent history of seizure.  It is unclear as to whether the patient is compliant with previously prescribed antiepileptics. Mentation gradually improved over the next 20 minutes.  [PM]    Clinical Course User Index [PM] Laurice Maude BROCKS, MD                                 Medical Decision Making Patient brought in after possible seizure or syncope.  He complained primarily of low back pain after this event.  During ED evaluation he may have had an unwitnessed seizure.  In between returning from CT and being reevaluated by nursing he became unresponsive.  He appeared to be postictal.  His mental status improved back to his baseline over the next 30 minutes.  Lactic obtained after this event was significantly elevated consistent with likely seizure activity that was not witnessed.  Case briefly discussed with neurology, Dr. Voncile, who recommended Depakote   load.  Given likely seizure at home and recurrent seizure here in the ED and associated significant low back pain I feel the patient would benefit from overnight observation both for pain control and seizure precautions.  Depakote  load administered.  P.O. Topamax ordered.  Hospitalist service made aware of case.  Amount and/or Complexity of Data Reviewed Labs: ordered. Radiology: ordered.  Risk Prescription drug management.   CRITICAL CARE Performed by: Maude BROCKS Laurice   Total critical care time: 60  minutes  Critical care time was exclusive of separately billable procedures and treating other patients.  Critical care was necessary to treat or prevent imminent or life-threatening deterioration.  Critical care was time spent personally by me on the following activities: development of treatment plan with patient and/or surrogate as well as nursing, discussions with consultants, evaluation of patient's response to treatment, examination of patient, obtaining history from patient or surrogate, ordering and performing treatments and interventions, ordering and review of laboratory studies, ordering and review of radiographic studies, pulse oximetry and re-evaluation of patient's condition.      Final diagnoses:  Syncope, unspecified syncope type  Seizure [facility])    ED Discharge Orders     None          Laurice Maude BROCKS, MD 05/03/24 1539

## 2024-05-04 DIAGNOSIS — R569 Unspecified convulsions: Secondary | ICD-10-CM | POA: Diagnosis not present

## 2024-05-04 LAB — COMPREHENSIVE METABOLIC PANEL WITH GFR
ALT: 31 U/L (ref 0–44)
AST: 26 U/L (ref 15–41)
Albumin: 3.8 g/dL (ref 3.5–5.0)
Alkaline Phosphatase: 85 U/L (ref 38–126)
Anion gap: 12 (ref 5–15)
BUN: 23 mg/dL — ABNORMAL HIGH (ref 6–20)
CO2: 22 mmol/L (ref 22–32)
Calcium: 8.9 mg/dL (ref 8.9–10.3)
Chloride: 108 mmol/L (ref 98–111)
Creatinine, Ser: 1.12 mg/dL (ref 0.61–1.24)
GFR, Estimated: >60 mL/min (ref >60)
Glucose, Bld: 114 mg/dL — ABNORMAL HIGH (ref 70–99)
Potassium: 3.4 mmol/L — ABNORMAL LOW (ref 3.5–5.1)
Sodium: 143 mmol/L (ref 135–145)
Total Bilirubin: 0.5 mg/dL (ref 0.0–1.2)
Total Protein: 6.2 g/dL — ABNORMAL LOW (ref 6.5–8.1)

## 2024-05-04 LAB — HIV ANTIBODY (ROUTINE TESTING W REFLEX): HIV Screen 4th Generation wRfx: NONREACTIVE

## 2024-05-04 LAB — CBC
HCT: 38.2 % — ABNORMAL LOW (ref 39.0–52.0)
Hemoglobin: 12.4 g/dL — ABNORMAL LOW (ref 13.0–17.0)
MCH: 30.5 pg (ref 26.0–34.0)
MCHC: 32.5 g/dL (ref 30.0–36.0)
MCV: 94.1 fL (ref 80.0–100.0)
Platelets: 167 K/uL (ref 150–400)
RBC: 4.06 MIL/uL — ABNORMAL LOW (ref 4.22–5.81)
RDW: 12.3 % (ref 11.5–15.5)
WBC: 10.4 K/uL (ref 4.0–10.5)
nRBC: 0 % (ref 0.0–0.2)

## 2024-05-04 LAB — GLUCOSE, CAPILLARY
Glucose-Capillary: 231 mg/dL — ABNORMAL HIGH (ref 70–99)
Glucose-Capillary: 270 mg/dL — ABNORMAL HIGH (ref 70–99)

## 2024-05-04 LAB — CK: Total CK: 435 U/L — ABNORMAL HIGH (ref 49–397)

## 2024-05-04 MED ORDER — TOPIRAMATE 100 MG PO TABS: 100.0000 mg | ORAL_TABLET | Freq: Every day | ORAL | 1 refills | Status: AC

## 2024-05-04 NOTE — Evaluation (Signed)
 Physical Therapy Evaluation Only Patient Details Name: Cody Berger MRN: [id number] DOB: 10-20-1965 Today's Date: 05/04/2024  History of Present Illness  Cody Berger is a 58 y.o. male presents after he fell off his toilet hitting his head while in the bathroom at home. Lumbar CT: Stable moderate L2 compression fracture. Posterior fusion hardware from T11 to L4 bridging patient's L2 compression fracture is intact. Head CT and Cervical spine CT negative for acute injury; pelvis xray negative for acute fracture.  PMH: type 2 diabetes, alcohol  use, tobacco use, chronic periodontitis, asthma, GERD, hyperlipidemia, secondary hypertension, CSF leak, lumbar compression fracture, status post lumbar fusion, necrotizing fasciitis, basal cell carcinoma of the face, OSA, polyarthralgia, PTSD, schizoaffective disorder depressive type, tardive dyskinesia, migraine headaches, seizure disorder  Clinical Impression  Pt reports ind at baseline, not using AD in the home, brings [facility] when walking for exercise but not always using it, does have RW and w/c from previous back surgery. On eval, pt impulsive with all mobility, therapist cuing pt on safety with mobility, benefit of non-slip socks and AD use. Pt completes bed mobility, transfers and ambulation with supv for safety. Pt with improved safety when ambulating with RW; more receptive to safety cues and better safety awareness once ambulating in hallway after voiding bladder. Educated pt on AD use as needed. Pt reports back pain is chronic, though does feel worse currently, and pt reports it feels muscular and tight when ambulating without LE buckling ot LOB noted. Pt denies loss of bowel/bladder control or numbness/tingling throughout BLE and perineum region. Pt reports OPPT post back surgery; not interested at this time. Pt planning to d/c home with spouse support today; no f/u recommended.         If plan is discharge home, recommend the  following: Assist for transportation;Help with stairs or ramp for entrance;A little help with walking and/or transfers;A little help with bathing/dressing/bathroom;Assistance with cooking/housework   Can travel by private vehicle        Equipment Recommendations None recommended by PT  Recommendations for Other Services       Functional Status Assessment Patient has not had a recent decline in their functional status     Precautions / Restrictions Precautions Precautions: Fall;Other (comment) Precaution/Restrictions Comments: seizure Restrictions Weight Bearing Restrictions Per Provider Order: No      Mobility  Bed Mobility Overal bed mobility: Needs Assistance Bed Mobility: Supine to Sit           General bed mobility comments: impulsively exiting bed due to urinary urge, supv for safety    Transfers Overall transfer level: Needs assistance Equipment used: None Transfers: Sit to/from Stand Sit to Stand: Supervision           General transfer comment: impulsively transferring due to urinary urge, cues for safety    Ambulation/Gait Ambulation/Gait assistance: Supervision Gait Distance (Feet): 300 Feet Assistive device: Rolling walker (2 wheels), None Gait Pattern/deviations: Step-through pattern, Decreased stride length Gait velocity: decreased     General Gait Details: impulsively ambulates to restroom holding onto walls and furniture, no LE buckling, therapist attempting to cue pt on safety with AD use and donning non-slip socks but pt self directed; after voiding bladder, pt agreeable to non-slip socks, using RW in hallway with step through gait pattern, able to navigate around obstacles without difficulty, completes head turns R/L without LOB or drifting, able to change directions without LOB, supv for safety  Stairs  Wheelchair Mobility     Tilt Bed    Modified Rankin (Stroke Patients Only)       Balance Overall balance  assessment: Mild deficits observed, not formally tested                                           Pertinent Vitals/Pain Pain Assessment Pain Assessment: 0-10 Pain Score: 8  Pain Location: back and L side of head Pain Descriptors / Indicators: Grimacing, Guarding Pain Intervention(s): Limited activity within patient's tolerance, Monitored during session, Premedicated before session, Repositioned    Home Living Family/patient expects to be discharged to:: Private residence Living Arrangements: Spouse/significant other Available Help at Discharge: Family;Available 24 hours/day Type of Home: Apartment Home Access: Level entry       Home Layout: One level Home Equipment: Agricultural Consultant (2 wheels);Cane - single point;Wheelchair - manual      Prior Function Prior Level of Function : Independent/Modified Independent             Mobility Comments: pt reports ind without AD in the home, brings [facility] when ambulating for exercise but doesn't always use it ADLs Comments: pt reports ind without AD     Extremity/Trunk Assessment   Upper Extremity Assessment Upper Extremity Assessment: Overall WFL for tasks assessed    Lower Extremity Assessment Lower Extremity Assessment: Overall WFL for tasks assessed;RLE deficits/detail;LLE deficits/detail RLE Deficits / Details: good sensation throughout, full ankle dorsiflexion and plantarflexion, full knee extension, hip abduction 3+/5 RLE Sensation: WNL RLE Coordination: WNL LLE Deficits / Details: good sensation throughout, full ankle dorsiflexion and plantarflexion, full knee extension, hip abduction 3+/5 LLE Sensation: WNL LLE Coordination: WNL       Communication   Communication Communication: No apparent difficulties    Cognition Arousal: Alert Behavior During Therapy: WFL for tasks assessed/performed, Impulsive   PT - Cognitive impairments: No apparent impairments                         Following  commands: Intact       Cueing       General Comments      Exercises     Assessment/Plan    PT Assessment Patient does not need any further PT services  PT Problem List         PT Treatment Interventions      PT Goals (Current goals can be found in the Care Plan section)  Acute Rehab PT Goals Patient Stated Goal: agreeable to acute PT eval PT Goal Formulation: All assessment and education complete, DC therapy    Frequency       Co-evaluation               AM-PAC PT 6 Clicks Mobility  Outcome Measure Help needed turning from your back to your side while in a flat bed without using bedrails?: A Little Help needed moving from lying on your back to sitting on the side of a flat bed without using bedrails?: A Little Help needed moving to and from a bed to a chair (including a wheelchair)?: A Little Help needed standing up from a chair using your arms (e.g., wheelchair or bedside chair)?: A Little Help needed to walk in hospital room?: A Little Help needed climbing 3-5 steps with a railing? : A Little 6 Click Score: 18    End of Session  Equipment Utilized During Treatment: Gait belt Activity Tolerance: Patient tolerated treatment well Patient left: in bed;with call bell/phone within reach;with bed alarm set Nurse Communication: Mobility status PT Visit Diagnosis: History of falling (Z91.81)    Time: 8592-8567 PT Time Calculation (min) (ACUTE ONLY): 25 min   Charges:   PT Evaluation $PT Eval Moderate Complexity: 1 Mod PT Treatments $Gait Training: 8-22 mins PT General Charges $$ ACUTE PT VISIT: 1 Visit         Tori Lauren Aguayo PT, DPT 05/04/24, 2:52 PM

## 2024-05-04 NOTE — Inpatient Diabetes Management (Signed)
 Inpatient Diabetes Program Recommendations  AACE/ADA: New Consensus Statement on Inpatient Glycemic Control (2015)  Target Ranges:  Prepandial:   less than 140 mg/dL      Peak postprandial:   less than 180 mg/dL (1-2 hours)      Critically ill patients:  140 - 180 mg/dL   Lab Results  Component Value Date   GLUCAP 231 (H) 05/04/2024   HGBA1C 8.2 (H) 05/03/2024    Review of Glycemic Control  Latest Reference Range & Units 05/03/24 21:51 05/04/24 07:39  Glucose-Capillary 70 - 99 mg/dL 731 (H) 768 (H)  (H): Data is abnormally high Diabetes history: Type 2 DM Outpatient Diabetes medications: Ozempic 0.5 mg qwk Current orders for Inpatient glycemic control: Novolog  0-9 units TID   Inpatient Diabetes Program Recommendations:    Consider adding Semglee 8 units every day.   Thanks, Tinnie Minus, MSN, RNC-OB Diabetes Coordinator [phone] (8a-5p)

## 2024-05-04 NOTE — Progress Notes (Signed)
   05/04/24 1155  TOC Brief Assessment  Insurance and Status Reviewed  Patient has primary care physician Yes  Home environment has been reviewed Resides with spouse in an apartment  Prior level of function: Independent with ADLs at baseline  Prior/Current Home Services No current home services  Social Drivers of Health Review SDOH reviewed no interventions necessary  Readmission risk has been reviewed Yes  Transition of care needs no transition of care needs at this time

## 2024-05-04 NOTE — Discharge Summary (Signed)
 Physician Discharge Summary   Patient: Cody Berger MRN: [id number] DOB: [birth year]  Admit date:     05/03/2024  Discharge date: 05/04/2024  Discharge Physician: [name]   PCP: [facility]   Recommendations at discharge:    Follow up with Neurology Follow up with Primary Care Continue to follow up on compliance with antiepileptic medication and seizure precautions Check valproate levels routinely  Discharge Diagnoses: Principal Problem:   Seizures (HCC) Active Problems:   S/P lumbar fusion   Diabetes (HCC)   Asthma without status asthmaticus   GERD (gastroesophageal reflux disease)   Mixed hyperlipidemia   Migraines   Secondary hypertension   Tobacco use   Hyperglycemia  Resolved Problems:   * No resolved hospital problems. *  Hospital Course:  Per H&P: Cody Berger is a 58 y.o. male with medical history significant of type 2 diabetes, alcohol  use, tobacco use, chronic periodontitis, asthma, GERD, hyperlipidemia, secondary hypertension, CSF leak, lumbar compression fracture, status post lumbar fusion, necrotizing fasciitis, basal cell carcinoma of the face, OSA, polyarthralgia, PTSD, schizoaffective disorder depressive type, tardive dyskinesia, migraine headaches, seizure disorder who has not been taking his antiepileptic medications and presented to the emergency department BIBEMS after he fell off his toilet hitting his head while in the bathroom at home.  This has exacerbated the patient's chronic back pain.  He is currently somnolent after receiving valproic acid and unable to elaborate further.  No headache, chest or abdominal pain, has severe mid and lower back pain.   Lab work: CBC showed a white count of 14.5, hemoglobin 13.0 g/dL and platelets 818.  Alcohol  level was normal.  CMP showed a total protein was 6.2, glucose of 263 and BUN of 25 mg/dL.  The rest of the CMP normal.  Troponin was 16 then 22 ng/L.  Lactic acid was 1.5  then 12.6 and 2.6 mmol/L.  Valproic acid was less than 10 mcg/mL.SABRASABRA   Imaging: Portable 1 view chest radiograph with no active disease.  Pelvis x-ray no acute fracture or dislocation, but positive cytopenia.  CT head without contrast no acute brain injury.  CT cervical spine no acute cervical spine injury.  There was mild spondylosis with mild disc disease at C5-C6 level.  Minimal left-sided neuroforaminal narrowing at the C4-C5 level and right-sided neuroforaminal narrowing at C5-C6 level.  CT lumbar spine with no acute findings.  Stable L2 compression fracture.  Stable posterior fusion hardware from T11-L4.  Mild spondylosis of the lumbar spine.   ED course: Initial vital signs were temperature 97.7 F, pulse 56, respiration 20, BP 91/63 mmHg and O2 sat 99% on room air.  The patient received 2000 mL of normal saline bolus, morphine 4 mg IVP, valproic acid 450 mg IVPB and Topamax 100 mg p.o. daily after discussing with neurology.   10/30 -- pt requesting discharge home this AM.  Pain control is somewhat better.  He seems to lack insight that he had seizure due to not taking his medications, stating he had not had seizure in many many years and feels he should not need medications.  He was not accepting of my explanation that years without seizures while on medication is evidence the medication works, especially considering he had a seizure when he was not taking medication.  Pt's significant other at bedside was understanding of importance of pt being compliant with medication.   Assessment and Plan:  Principal Problem:   Seizures (HCC) Observation/PCU. Frequent neurochecks. Seizure precautions. Loaded with valproic acid  250 mg. Continue topiramate 100 mg p.o. daily. Diazepam  as needed for seizures. Follow-up with neurology as an outpatient.   Active Problems:   S/P lumbar fusion Continue Robaxin  100 mg every 6 hours as needed. Ketorolac  30 mg IVP x 2 doses tonight. Oxycodone  5 mg every  6 hours as needed. Morphine 4 mg IVP every 3 hours as needed for severe pain.     Diabetes (HCC) Associated with:   Hyperglycemia Continue semaglutide Pending A1c     Asthma without status asthmaticus No symptoms at this time. Bronchodilators as needed.     GERD (gastroesophageal reflux disease) Continue omeprazole      Mixed hyperlipidemia Continue rosuvastatin 10 mg p.o. daily.     Migraines Rizatriptan as needed.     Secondary hypertension Continue metoprolol  12.5 mg p.o. twice daily.     Tobacco use Tobacco cessation might be beneficial. If needed nicotine replacement therapy may be ordered.       Consultants: EDP discussed case with Neurology Procedures performed: None Disposition: Home Diet recommendation:  Carb modified  DISCHARGE MEDICATION: Allergies as of 05/04/2024       Reactions   Sumatriptan  Other (See Comments)   Made my head feel weird/uncomfortable.        Medication List     TAKE these medications    acetaminophen  500 MG tablet Commonly known as: TYLENOL  Take 500 mg by mouth 2 (two) times daily as needed for mild pain (pain score 1-3).   cetirizine 10 MG tablet Commonly known as: ZYRTEC Take 10 mg by mouth at bedtime.   diclofenac Sodium 1 % Gel Commonly known as: VOLTAREN Apply 4 g topically 3 (three) times daily as needed (for pain).   hydrOXYzine  25 MG capsule Commonly known as: VISTARIL  Take 25 mg by mouth 2 (two) times daily as needed for anxiety.   ibuprofen 200 MG tablet Commonly known as: ADVIL Take 400 mg by mouth every 6 (six) hours as needed for mild pain (pain score 1-3) (or headaches).   lidocaine  5 % Commonly known as: LIDODERM  Place 1 patch onto the skin 2 (two) times daily as needed (for pain- Remove & Discard patch within 12 hours or as directed by MD).   methocarbamol  500 MG tablet Commonly known as: ROBAXIN  Take 1 tablet (500 mg total) by mouth every 6 (six) hours as needed for muscle spasms. What  changed: when to take this   metoprolol  tartrate 25 MG tablet Commonly known as: LOPRESSOR  Take 12.5 mg by mouth in the morning and at bedtime.   omeprazole 20 MG capsule Commonly known as: PRILOSEC Take 20 mg by mouth daily before breakfast.   rizatriptan 10 MG tablet Commonly known as: MAXALT Take 10 mg by mouth daily as needed for migraine (may repeat ONCE in 2 hours, if no relief and cannot exceed 20 mg/24 hours). May repeat in 2 hours if needed   rosuvastatin 20 MG tablet Commonly known as: CRESTOR Take 10 mg by mouth daily.   Semaglutide(0.25 or 0.5MG /DOS) 2 MG/3ML Sopn Inject 0.5 mg into the skin every Saturday.   topiramate 100 MG tablet Commonly known as: TOPAMAX Take 1 tablet (100 mg total) by mouth daily.   venlafaxine XR 75 MG 24 hr capsule Commonly known as: EFFEXOR-XR Take 75 mg by mouth daily with breakfast.   Vitamin D3 1000 units Caps Take 3,000 Units by mouth every evening.        Follow-up Information     Marsa Arlena NOVAK, MD. Schedule  an appointment as soon as possible for a visit.   Specialty: Neurology Why: Hospital follow up for breakthrough seizure due to medication non-compliance. Contact information: [address] [place] [address] [phone]         [facility] Schedule an appointment as soon as possible for a visit.   Why: Follow up in 1-2 weeks Contact information: [address] [facility] [place] [address] [phone]                Discharge Exam: Filed Weights   05/03/24 1242  Weight: 68 kg   General exam: awake, alert, no acute distress HEENT: moist mucus membranes, hearing grossly normal  Respiratory system: CTAB, no wheezes, rales or rhonchi, normal respiratory effort. Cardiovascular system: normal S1/S2, RRR, no JVD, murmurs, rubs, gallops, no pedal edema.   Gastrointestinal system: soft, NT, ND, no HSM felt, +bowel sounds. Central nervous system: A&O x 3. no gross  focal neurologic deficits, normal speech Extremities: moves all , no edema, normal tone Psychiatry: irritable mood, congruent affect, judgement and insight appear abnormal   Condition at discharge: stable  The results of significant diagnostics from this hospitalization (including imaging, microbiology, ancillary and laboratory) are listed below for reference.   Imaging Studies: CT Head Wo Contrast Result Date: 05/03/2024 CLINICAL DATA:  Clemens off toilet hitting head. EXAM: CT HEAD WITHOUT CONTRAST CT CERVICAL SPINE WITHOUT CONTRAST TECHNIQUE: Multidetector CT imaging of the head and cervical spine was performed following the standard protocol without intravenous contrast. Multiplanar CT image reconstructions of the cervical spine were also generated. RADIATION DOSE REDUCTION: This exam was performed according to the departmental dose-optimization program which includes automated exposure control, adjustment of the mA and/or kV according to patient size and/or use of iterative reconstruction technique. COMPARISON:  04/13/2023 FINDINGS: CT HEAD FINDINGS Brain: No evidence of acute infarction, hemorrhage, hydrocephalus, extra-axial collection or mass lesion/mass effect. Vascular: No hyperdense vessel or unexpected calcification. Skull: Normal. Negative for fracture or focal lesion. Sinuses/Orbits: No acute finding. Other: None. CT CERVICAL SPINE FINDINGS Alignment: Normal. Skull base and vertebrae: Vertebral body heights are normal. There is mild spondylosis throughout the cervical spine to include uncovertebral joint spurring and facet arthropathy. Minimal left-sided neural foraminal narrowing at the C4-5 level and right-sided neural foraminal narrowing at the C5-6 level. No acute fracture. Soft tissues and spinal canal: No prevertebral fluid or swelling. No visible canal hematoma. Disc levels:  Mild disc space narrowing at the C5-6 level. Upper chest: No acute findings. Other: None. IMPRESSION: 1. No  acute brain injury. 2. No acute cervical spine injury. 3. Mild spondylosis of the cervical spine with mild disc disease at the C5-6 level. Minimal left-sided neural foraminal narrowing at the C4-5 level and right-sided neural foraminal narrowing at the C5-6 level. Electronically Signed   By: Toribio Agreste M.D.   On: 05/03/2024 15:29   CT Cervical Spine Wo Contrast Result Date: 05/03/2024 CLINICAL DATA:  Clemens off toilet hitting head. EXAM: CT HEAD WITHOUT CONTRAST CT CERVICAL SPINE WITHOUT CONTRAST TECHNIQUE: Multidetector CT imaging of the head and cervical spine was performed following the standard protocol without intravenous contrast. Multiplanar CT image reconstructions of the cervical spine were also generated. RADIATION DOSE REDUCTION: This exam was performed according to the departmental dose-optimization program which includes automated exposure control, adjustment of the mA and/or kV according to patient size and/or use of iterative reconstruction technique. COMPARISON:  04/13/2023 FINDINGS: CT HEAD FINDINGS Brain: No evidence of acute infarction, hemorrhage, hydrocephalus, extra-axial collection or  mass lesion/mass effect. Vascular: No hyperdense vessel or unexpected calcification. Skull: Normal. Negative for fracture or focal lesion. Sinuses/Orbits: No acute finding. Other: None. CT CERVICAL SPINE FINDINGS Alignment: Normal. Skull base and vertebrae: Vertebral body heights are normal. There is mild spondylosis throughout the cervical spine to include uncovertebral joint spurring and facet arthropathy. Minimal left-sided neural foraminal narrowing at the C4-5 level and right-sided neural foraminal narrowing at the C5-6 level. No acute fracture. Soft tissues and spinal canal: No prevertebral fluid or swelling. No visible canal hematoma. Disc levels:  Mild disc space narrowing at the C5-6 level. Upper chest: No acute findings. Other: None. IMPRESSION: 1. No acute brain injury. 2. No acute cervical spine  injury. 3. Mild spondylosis of the cervical spine with mild disc disease at the C5-6 level. Minimal left-sided neural foraminal narrowing at the C4-5 level and right-sided neural foraminal narrowing at the C5-6 level. Electronically Signed   By: Toribio Agreste M.D.   On: 05/03/2024 15:29   CT Lumbar Spine Wo Contrast Result Date: 05/03/2024 CLINICAL DATA:  Fall.  Low back pain. EXAM: CT LUMBAR SPINE WITHOUT CONTRAST TECHNIQUE: Multidetector CT imaging of the lumbar spine was performed without intravenous contrast administration. Multiplanar CT image reconstructions were also generated. RADIATION DOSE REDUCTION: This exam was performed according to the departmental dose-optimization program which includes automated exposure control, adjustment of the mA and/or kV according to patient size and/or use of iterative reconstruction technique. COMPARISON:  CT 05/14/2022 FINDINGS: Segmentation: 5 lumbar type vertebrae. Alignment: Normal. Vertebrae: Exam demonstrates evidence of patient's known stable moderate L2 compression fracture. No new/acute compression fractures visualized. Subtle stable irregularity of the superior endplate of L4. Posterior fusion hardware from T11 to L4 bridging patient's L2 compression fracture is intact. There is mild spondylosis of the lumbar spine. Paraspinal and other soft tissues: Negative. Disc levels: No significant disc space narrowing. L1-2 level is unremarkable. Small bony fragment in the left posterior aspect of the spinal canal at the L2 level adjacent the facet joint unchanged. L2-3 level unremarkable. L3-4, L4-5 and L5-S1 levels unremarkable without canal stenosis or significant neural foraminal narrowing. IMPRESSION: 1. No acute findings. 2. Stable moderate L2 compression fracture. Posterior fusion hardware from T11 to L4 bridging patient's L2 compression fracture is intact. 3. Mild spondylosis of the lumbar spine. Electronically Signed   By: Toribio Agreste M.D.   On: 05/03/2024  15:01   DG Pelvis Portable Result Date: 05/03/2024 CLINICAL DATA:  Fall. EXAM: PORTABLE PELVIS 1-2 VIEWS COMPARISON:  None Available. FINDINGS: No acute fracture or dislocation. The bones are osteopenic. Partially visualized lumbar fusion hardware. The soft tissues are unremarkable. IMPRESSION: 1. No acute fracture or dislocation. 2. Osteopenia. Electronically Signed   By: Vanetta Chou M.D.   On: 05/03/2024 14:29   DG Chest Port 1 View Result Date: 05/03/2024 CLINICAL DATA:  Fall. EXAM: PORTABLE CHEST 1 VIEW COMPARISON:  Chest radiograph dated 04/13/2023 FINDINGS: No focal consolidation, pleural effusion, or pneumothorax. Bilateral upper lobe linear scarring. The cardiac silhouette is within normal limits. No acute osseous pathology. IMPRESSION: No active disease. Electronically Signed   By: Vanetta Chou M.D.   On: 05/03/2024 14:25    Microbiology: Results for orders placed or performed during the hospital encounter of 12/02/21  Surgical pcr screen     Status: Abnormal   Collection Time: 12/02/21  9:40 PM   Specimen: Nasal Mucosa; Nasal Swab  Result Value Ref Range Status   MRSA, PCR NEGATIVE NEGATIVE Final   Staphylococcus aureus POSITIVE (A)  NEGATIVE Final    Comment: (NOTE) The Xpert SA Assay (FDA approved for NASAL specimens in patients 27 years of age and older), is one component of a comprehensive surveillance program. It is not intended to diagnose infection nor to guide or monitor treatment. Performed at [facility], [address], [place], [place] [address]     Labs: CBC: No results for input(s): WBC, NEUTROABS, HGB, HCT, MCV, PLT in the last 168 hours.  Basic Metabolic Panel: No results for input(s): NA, K, CL, CO2, GLUCOSE, BUN, CREATININE, CALCIUM , MG, PHOS in the last 168 hours.  Liver Function Tests: No results for input(s): AST, ALT, ALKPHOS, BILITOT, PROT, ALBUMIN in the last 168 hours.  CBG: No  results for input(s): GLUCAP in the last 168 hours.   Discharge time spent: less than 30 minutes.  Signed: [name], DO [facility] 05/15/2024
